# Patient Record
Sex: Female | Born: 1985 | Race: White | Hispanic: No | Marital: Married | Smoking: Never smoker
Health system: Southern US, Community
[De-identification: ages and names within clinical notes are randomized; demographics above are authoritative.]

## PROBLEM LIST (undated history)

## (undated) DIAGNOSIS — R112 Nausea with vomiting, unspecified: Secondary | ICD-10-CM

## (undated) DIAGNOSIS — R519 Headache, unspecified: Secondary | ICD-10-CM

## (undated) DIAGNOSIS — R569 Unspecified convulsions: Secondary | ICD-10-CM

## (undated) DIAGNOSIS — Z9889 Other specified postprocedural states: Secondary | ICD-10-CM

## (undated) DIAGNOSIS — F419 Anxiety disorder, unspecified: Secondary | ICD-10-CM

## (undated) DIAGNOSIS — R51 Headache: Secondary | ICD-10-CM

## (undated) DIAGNOSIS — F329 Major depressive disorder, single episode, unspecified: Secondary | ICD-10-CM

## (undated) DIAGNOSIS — F32A Depression, unspecified: Secondary | ICD-10-CM

## (undated) DIAGNOSIS — O24419 Gestational diabetes mellitus in pregnancy, unspecified control: Secondary | ICD-10-CM

## (undated) HISTORY — DX: Depression, unspecified: F32.A

## (undated) HISTORY — DX: Headache: R51

## (undated) HISTORY — DX: Gestational diabetes mellitus in pregnancy, unspecified control: O24.419

## (undated) HISTORY — DX: Major depressive disorder, single episode, unspecified: F32.9

## (undated) HISTORY — DX: Headache, unspecified: R51.9

## (undated) HISTORY — DX: Anxiety disorder, unspecified: F41.9

## (undated) HISTORY — PX: FRACTURE SURGERY: SHX138

---
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
## 2007-01-07 ENCOUNTER — Inpatient Hospital Stay (HOSPITAL_COMMUNITY): Admission: AD | Admit: 2007-01-07 | Discharge: 2007-01-07 | Payer: Self-pay | Admitting: *Deleted

## 2007-03-20 ENCOUNTER — Inpatient Hospital Stay (HOSPITAL_COMMUNITY): Admission: AD | Admit: 2007-03-20 | Discharge: 2007-03-20 | Payer: Self-pay | Admitting: Obstetrics and Gynecology

## 2007-04-19 ENCOUNTER — Encounter (INDEPENDENT_AMBULATORY_CARE_PROVIDER_SITE_OTHER): Payer: Self-pay | Admitting: Obstetrics and Gynecology

## 2007-04-19 ENCOUNTER — Inpatient Hospital Stay (HOSPITAL_COMMUNITY): Admission: AD | Admit: 2007-04-19 | Discharge: 2007-04-22 | Payer: Self-pay | Admitting: Obstetrics and Gynecology

## 2007-04-23 ENCOUNTER — Encounter: Admission: RE | Admit: 2007-04-23 | Discharge: 2007-05-22 | Payer: Self-pay | Admitting: *Deleted

## 2007-04-24 ENCOUNTER — Inpatient Hospital Stay (HOSPITAL_COMMUNITY): Admission: AD | Admit: 2007-04-24 | Discharge: 2007-04-24 | Payer: Self-pay | Admitting: *Deleted

## 2007-05-23 ENCOUNTER — Encounter: Admission: RE | Admit: 2007-05-23 | Discharge: 2007-06-22 | Payer: Self-pay | Admitting: *Deleted

## 2007-06-23 ENCOUNTER — Encounter: Admission: RE | Admit: 2007-06-23 | Discharge: 2007-07-22 | Payer: Self-pay | Admitting: *Deleted

## 2007-07-23 ENCOUNTER — Encounter: Admission: RE | Admit: 2007-07-23 | Discharge: 2007-08-22 | Payer: Self-pay | Admitting: *Deleted

## 2007-08-23 ENCOUNTER — Encounter: Admission: RE | Admit: 2007-08-23 | Discharge: 2007-09-22 | Payer: Self-pay | Admitting: *Deleted

## 2007-09-23 ENCOUNTER — Encounter: Admission: RE | Admit: 2007-09-23 | Discharge: 2007-10-11 | Payer: Self-pay | Admitting: *Deleted

## 2007-10-21 ENCOUNTER — Encounter: Admission: RE | Admit: 2007-10-21 | Discharge: 2007-11-20 | Payer: Self-pay | Admitting: *Deleted

## 2007-11-21 ENCOUNTER — Encounter: Admission: RE | Admit: 2007-11-21 | Discharge: 2007-12-20 | Payer: Self-pay | Admitting: *Deleted

## 2007-12-21 ENCOUNTER — Encounter: Admission: RE | Admit: 2007-12-21 | Discharge: 2008-01-20 | Payer: Self-pay | Admitting: *Deleted

## 2008-01-21 ENCOUNTER — Encounter: Admission: RE | Admit: 2008-01-21 | Discharge: 2008-02-19 | Payer: Self-pay | Admitting: *Deleted

## 2008-02-20 ENCOUNTER — Encounter: Admission: RE | Admit: 2008-02-20 | Discharge: 2008-03-21 | Payer: Self-pay | Admitting: *Deleted

## 2008-07-24 ENCOUNTER — Emergency Department (HOSPITAL_BASED_OUTPATIENT_CLINIC_OR_DEPARTMENT_OTHER): Admission: EM | Admit: 2008-07-24 | Discharge: 2008-07-24 | Payer: Self-pay | Admitting: Emergency Medicine

## 2010-12-26 NOTE — Discharge Summary (Signed)
[name], [name]            ACCOUNT NO.:  [id number]   MEDICAL RECORD NO.:  [id number]          PATIENT TYPE:  INP   LOCATION:  9124                          FACILITY:  [facility]   PHYSICIAN:  Maxie Better, M.D.DATE OF BIRTH:  Aug 11, 1986   DATE OF ADMISSION:  04/19/2007  DATE OF DISCHARGE:  04/22/2007                               DISCHARGE SUMMARY   ADMISSION DIAGNOSES:  1. Active labor.  2. Term gestation.  3. Iron Deficiency Anemia   DISCHARGE DIAGNOSES:  1. Term gestation, delivered.  2. Arrest of descent.  3. Iron-deficiency anemia.   PROCEDURE:  Primary cesarean section.   HISTORY OF PRESENT ILLNESS:  A 25 year old, gravida 1, para 0 female at  term presented in active labor.   HOSPITAL COURSE:  The patient was admitted.  At the time of her  admission, she was 4-5 cm, bulging membranes, -1 station.  Group B strep  was found to be positive.  The patient was admitted.  She got group B  strep prophylaxis.  The patient subsequently had epidural for pain  management, spontaneous rupture of membranes, clear fluid.  Low-dose  Pitocin was planned as needed.  The patient progressed well, pushed for  2+ hours without any descent or ability to deliver vaginally.  She was  taken to the operating room where she underwent a primary cesarean  section, live female, 8 pounds 12 ounces, Apgars 8 and 8, normal tubes  and ovaries.  The patient had an uncomplicated postoperative course.  Her CBC on postoperative day #1 showed a hemoglobin of 8.8, hematocrit  25.9, platelet count 273,000, white count 19.1.  She had an admission  hemoglobin 10.4.  The blood type was O+, rubella was immune.  By  postoperative day #3, she was tolerating a regular diet.  Incision had  no evidence of an infection.  She was asymptomatic from her anemia.  She  was started on iron supplementation.  The patient was deemed well to be  discharged home.   DISPOSITION:  Is home.   CONDITION:  Stable.   DISCHARGE MEDICATIONS:  1. Zoloft 50 mg p.o. daily.  2. Prenatal vitamins 1 p.o. daily.  3. Repliva.  4. Iron supplementation 1 p.o. daily.  5. Motrin 600 mg every 8 hours p.r.n. pain.  6. Percocet 1-2 tablets every 4 hours p.r.n. pain.   DISCHARGE INSTRUCTIONS:  Per the postpartum booklet given.   FOLLOW-UP APPOINTMENT:  At [facility] in 6 weeks.      Maxie Better, M.D.  Electronically Signed     [place]/MEDQ  D:  05/25/2007  T:  05/26/2007  Job:  [id number]

## 2010-12-26 NOTE — Op Note (Signed)
[name], [name]            ACCOUNT NO.:  [id number]   MEDICAL RECORD NO.:  [id number]          PATIENT TYPE:  INP   LOCATION:  9124                          [facility]ILITY:  [facility]   PHYSICIAN:  Maxie Better, M.D.DATE OF BIRTH:  12-11-1985   DATE OF PROCEDURE:  04/19/2007  DATE OF DISCHARGE:                               OPERATIVE REPORT   PREOPERATIVE DIAGNOSIS:  Arrest of descent.   PROCEDURE:  Primary cesarean section Kerr hysterotomy.   POSTOPERATIVE DIAGNOSIS:  Arrest of descent.   ANESTHESIA:  Epidural.   SURGEON:  Maxie Better, M.D.   ASSISTANT:  Osborn Coho, M.D.   PROCEDURE:  Under adequate epidural anesthesia, the patient was placed  in a supine position.  She was sterilely prepped and draped in usual  fashion.  An indwelling Foley catheter was already in place.  Pfannenstiel skin incision was then made, carried down to the rectus  fascia.  The rectus fascia was opened transversely.  The rectus fascia  was then bluntly and sharply dissected off the rectus muscle in superior  and inferior fashion.  The rectus muscles was split in the midline.  The  parietal peritoneum was opened sharply and extended.  The vesicouterine  peritoneum was opened transversely.  The bladder was then bluntly  dissected off the lower uterine segment and was placed inferiorly.  A  curvilinear low transverse uterine incision was then made and extended  with bandage scissors.  Subsequent delivery of a live female was  accomplished.  It was bulb suctioned on the abdomen.  The cord was  clamped and cut.  The baby was transferred to the awaiting pediatricians  who assigned Apgars of 8 and 8 at one and five minutes.  The placenta  was spontaneous, intact, sent to pathology.  Uterine cavity was cleaned  of debris.  Uterine incision had a slight extension on the left angle  otherwise it was unremarkable.  Uterine incision was then closed with 0  Monocryl running-locked stitch first  layer, second layer was imbricated  using 0 Monocryl suture.  Good hemostasis was then noted.  Normal tubes  and ovaries were noted bilaterally.  The abdomen was then copiously  irrigated, suctioned of debris.  Reinspection of the incision site  showed good hemostasis.  The urine was reported as clear yellow urine at  that time.  The parietal peritoneum was then closed with 2-0 Vicryl  suture.  The rectus fascia was closed with 0 Vicryl x2.  The  subcutaneous area was irrigated, small bleeders cauterized.  Interrupted  2-0 plain sutures were then placed and then the skin approximated with  Ethicon staples.  Specimen was placenta sent to pathology.  Estimated  blood loss was 800 mL.  Intraoperative fluid was 900 mL.  Urine output  300 mL.  Sponge and instrument counts x2 were correct.  Complication was  none.  Weight of the baby was 8 pounds 12 ounces.  The patient tolerated  the procedure well, was transferred to recovery in stable condition.     Maxie Better, M.D.  Electronically Signed    [place]/MEDQ  D:  04/19/2007  T:  04/20/2007  Job:  [id number]

## 2010-12-26 NOTE — Consult Note (Signed)
[name], [name]            ACCOUNT NO.:  [id number]   MEDICAL RECORD NO.:  [id number]          PATIENT TYPE:  MAT   LOCATION:  MATC                          [facility]ACILITY:  [facility]   PHYSICIAN:  Lenoard Aden, M.D.DATE OF BIRTH:  1985-11-13   DATE OF CONSULTATION:  DATE OF DISCHARGE:                                 CONSULTATION   CHIEF COMPLAINT:  Questionable rupture of membranes.   She is a 25 year old white female, G1, P0 at 3 weeks' gestation with a  questionable fluid leakage in the shower.  She is a nonsmoker,  nondrinker.  She denies domestic or physical violence.   MEDICATIONS:  Prenatal vitamins and Zoloft.   She has a history of:  1. Anxiety.  2. Seizure disorder as a child.  3. History of abnormal Pap smear.   SOCIAL HISTORY:  Noncontributory.   FAMILY HISTORY:  Colon cancer, diabetes, hypertension, heart disease,  pancreatic cancer.   PHYSICAL EXAMINATION:  GENERAL:  She is an anxious-appearing white  female in no acute distress.  VITAL SIGNS:  Stable, afebrile.  HEENT:  LUNGS:  Clear.  HEART:  Reveals tachycardia but otherwise normal rate and rhythm.  ABDOMEN:  Soft, gravid, nontender.  PELVIC:  Reveals negative fern.  Negative Nitrazine.  Cervix is 2-cm,  50% vertex, minus 1.  Membranes are palpable.  No pooling or fluid  leakage is noted.  EXTREMITIES:  Show no cords.  NEUROLOGIC:  Nonfocal.  SKIN:  Intact.   Initial fetal heart rate in the 180s with notable maternal anxiety.  No  contractions noted.  Fetal heart rate is now in the 150s with good beat-  to-beat variability and accelerations noted.   IMPRESSION:  A 36-week intrauterine pregnancy with no evidence of  spontaneous rupture of membranes.   PLAN:  We will await a reactive NST.  Upon reassuring fetal heart rate  tracing, we will discharge home.  Precautions given.      Lenoard Aden, M.D.  Electronically Signed     [facility]/MEDQ  D:  03/20/2007  T:  03/20/2007  Job:  [id number]

## 2011-01-29 ENCOUNTER — Other Ambulatory Visit: Payer: Self-pay | Admitting: Obstetrics and Gynecology

## 2011-03-14 HISTORY — PX: CERVICAL BIOPSY  W/ LOOP ELECTRODE EXCISION: SUR135

## 2011-05-18 LAB — CBC
HCT: 33.3 % — ABNORMAL LOW (ref 36.0–46.0)
Hemoglobin: 11.1 g/dL — ABNORMAL LOW (ref 12.0–15.0)
MCHC: 33.5 g/dL (ref 30.0–36.0)
MCV: 87.1 fL (ref 78.0–100.0)
RBC: 3.82 MIL/uL — ABNORMAL LOW (ref 3.87–5.11)

## 2011-05-18 LAB — URINALYSIS, ROUTINE W REFLEX MICROSCOPIC
Bilirubin Urine: NEGATIVE
Glucose, UA: NEGATIVE mg/dL
Hgb urine dipstick: NEGATIVE
Specific Gravity, Urine: 1.019 (ref 1.005–1.030)
pH: 7 (ref 5.0–8.0)

## 2011-05-18 LAB — BASIC METABOLIC PANEL
BUN: 11 mg/dL (ref 6–23)
CO2: 27 mEq/L (ref 19–32)
Calcium: 9.5 mg/dL (ref 8.4–10.5)
Chloride: 102 mEq/L (ref 96–112)
Creatinine, Ser: 0.6 mg/dL (ref 0.4–1.2)
GFR calc Af Amer: >60 mL/min (ref >60)
GFR calc non Af Amer: >60 mL/min (ref >60)
Glucose, Bld: 106 mg/dL — ABNORMAL HIGH (ref 70–99)
Potassium: 4.2 mEq/L (ref 3.5–5.1)
Sodium: 138 mEq/L (ref 135–145)

## 2011-05-18 LAB — URINE MICROSCOPIC-ADD ON

## 2011-05-18 LAB — OCCULT BLOOD X 1 CARD TO LAB, STOOL: Fecal Occult Bld: POSITIVE

## 2011-05-25 LAB — COMPREHENSIVE METABOLIC PANEL
ALT: 18
Albumin: 2.9 — ABNORMAL LOW
Alkaline Phosphatase: 175 — ABNORMAL HIGH
BUN: 6
Chloride: 104
Potassium: 3.5
Sodium: 141
Total Bilirubin: 0.7
Total Protein: 6.6

## 2011-05-25 LAB — URIC ACID: Uric Acid, Serum: 7.5 — ABNORMAL HIGH

## 2011-05-25 LAB — CBC
HCT: 25.9 — ABNORMAL LOW
HCT: 30.2 — ABNORMAL LOW
Hemoglobin: 8.8 — ABNORMAL LOW
Hemoglobin: 9.9 — ABNORMAL LOW
Platelets: 237
Platelets: 306
RBC: 4.02
WBC: 14.7 — ABNORMAL HIGH
WBC: 14.8 — ABNORMAL HIGH
WBC: 19.1 — ABNORMAL HIGH

## 2011-05-25 LAB — SAMPLE TO BLOOD BANK

## 2011-05-25 LAB — RPR: RPR Ser Ql: NONREACTIVE

## 2011-10-16 ENCOUNTER — Ambulatory Visit (INDEPENDENT_AMBULATORY_CARE_PROVIDER_SITE_OTHER): Payer: 59 | Admitting: Family Medicine

## 2011-10-16 VITALS — BP 121/73 | HR 99 | Temp 98.0°F | Resp 16 | Ht 63.0 in | Wt 146.0 lb

## 2011-10-16 DIAGNOSIS — J069 Acute upper respiratory infection, unspecified: Secondary | ICD-10-CM

## 2011-10-16 DIAGNOSIS — Z3201 Encounter for pregnancy test, result positive: Secondary | ICD-10-CM

## 2011-10-16 MED ORDER — IPRATROPIUM BROMIDE 0.06 % NA SOLN: 2.0000 | Freq: Three times a day (TID) | NASAL | Status: DC

## 2011-10-16 NOTE — Progress Notes (Signed)
Patient ID: [name] MRN: [id number], DOB: [birth year], 25 y.o. Date of Encounter: 10/16/2011, 2:13 PM  Primary Physician: No primary provider on file.  Chief Complaint:  Chief Complaint  Patient presents with  . Sinusitis    x  3 days  congestion  . Headache  . Cough    HPI: 26 y.o. year old female that is [redacted] weeks pregnant presents with 3 day history of nasal congestion, post nasal drip, and sinus pressure. Afebrile. No chills. No sore throat. Mild nonproductive cough. Sinus pressure has caused a sinus headache. No focal deficits. Normal hearing. No GI complaints. Has tried Tylenol for the headache with ok results.  Patient is also [redacted] weeks pregnant. Has routine appointment with OB, Dr. Vickey Sages, on 10/17/11. No abdominal pain or bleeding. She will discuss further treatment at that time.  No leg trauma, sedentary periods, h/o cancer, or tobacco use.  History reviewed. No pertinent past medical history.   Home Meds: Prior to Admission medications   Medication Sig Start Date End Date Taking? Authorizing Provider  prenatal vitamin w/FE, FA (NATACHEW) 29-1 MG CHEW Chew 1 tablet by mouth daily.   Yes Historical Provider, MD    Allergies:  Allergies  Allergen Reactions  . Latex     History   Social History  . Marital Status: Married    Spouse Name: N/A    Number of Children: N/A  . Years of Education: N/A   Occupational History  . Not on file.   Social History Main Topics  . Smoking status: Never Smoker   . Smokeless tobacco: Not on file  . Alcohol Use: No  . Drug Use: No  . Sexually Active: Not on file   Other Topics Concern  . Not on file   Social History Narrative  . No narrative on file     Review of Systems: Constitutional: negative for chills, fever, night sweats or weight changes Cardiovascular: negative for chest pain or palpitations Respiratory: negative for hemoptysis, wheezing, or shortness of breath Abdominal: negative for abdominal pain,  nausea, vomiting or diarrhea Dermatological: negative for rash Neurologic: negative for headache   Physical Exam: Blood pressure 121/73, pulse 99, temperature 98 F (36.7 C), temperature source Oral, resp. rate 16, height 5\' 3"  (1.6 m), weight 146 lb (66.225 kg)., Body mass index is 25.86 kg/(m^2). General: Well developed, well nourished, in no acute distress. Head: Normocephalic, atraumatic, eyes without discharge, sclera non-icteric, nares are congested. Bilateral auditory canals clear, TM's are without perforation, pearly grey with reflective cone of light bilaterally. Serous effusion bilaterally behind TM's. Left maxillary sinus TTP. Oral cavity moist, dentition normal. Posterior pharynx with post nasal drip. No peritonsillar abscess, erythema, or tonsillar exudate. Neck: Supple. No thyromegaly. Full ROM. No lymphadenopathy. Lungs: Clear bilaterally to auscultation without wheezes, rales, or rhonchi. Breathing is unlabored. Heart: RRR with S1 S2. No murmurs, rubs, or gallops appreciated. Msk:  Strength and tone normal for age. Extremities: No clubbing or cyanosis. No edema. Neuro: Alert and oriented X 3. Moves all extremities spontaneously. CNII-XII grossly in tact. Psych:  Responds to questions appropriately with a normal affect.     ASSESSMENT AND PLAN:  26 y.o. year old female with viral URI and pregnancy. -Atrovent NS 0.06% 2 sprays each nare bid prn #1 no RF -Discussed risks/benefits of above medication with usage in pregnancy in first trimester. She understands this and wished to proceed with symptomatic treatment. -Tylenol prn -Rest/fluids -RTC precautions -RTC 3-5 days if no improvement -  Follow up as directed with OB  Signed, Eula Listen, PA-C 10/16/2011 2:13 PM

## 2011-10-17 LAB — OB RESULTS CONSOLE RPR: RPR: NONREACTIVE

## 2011-10-17 LAB — OB RESULTS CONSOLE RUBELLA ANTIBODY, IGM: Rubella: IMMUNE

## 2011-10-17 LAB — OB RESULTS CONSOLE ABO/RH: RH Type: POSITIVE

## 2011-10-17 LAB — OB RESULTS CONSOLE HEPATITIS B SURFACE ANTIGEN: Hepatitis B Surface Ag: NEGATIVE

## 2011-10-17 LAB — OB RESULTS CONSOLE HIV ANTIBODY (ROUTINE TESTING): HIV: NONREACTIVE

## 2012-03-05 ENCOUNTER — Encounter: Payer: 59 | Attending: Obstetrics and Gynecology | Admitting: *Deleted

## 2012-03-05 DIAGNOSIS — Z713 Dietary counseling and surveillance: Secondary | ICD-10-CM | POA: Insufficient documentation

## 2012-03-05 DIAGNOSIS — O99810 Abnormal glucose complicating pregnancy: Secondary | ICD-10-CM | POA: Insufficient documentation

## 2012-03-06 ENCOUNTER — Encounter: Payer: Self-pay | Admitting: *Deleted

## 2012-03-06 NOTE — Patient Instructions (Signed)
Goals:  Check glucose levels per MD as instructed  Follow Gestational Diabetes Diet as instructed  Call for follow-up as needed    

## 2012-03-06 NOTE — Progress Notes (Signed)
  Patient was seen on 03/05/2012 for Gestational Diabetes self-management class at the [facility]. The following learning objectives were met by the patient during this course:   States the definition of Gestational Diabetes  States why dietary management is important in controlling blood glucose  Describes the effects each nutrient has on blood glucose levels  Demonstrates ability to create a balanced meal plan  Demonstrates carbohydrate counting   States when to check blood glucose levels  Demonstrates proper blood glucose monitoring techniques  States the effect of stress and exercise on blood glucose levels  States the importance of limiting caffeine and abstaining from alcohol and smoking  Blood glucose monitor given:  One Touch Ultra Mini Self Monitoring Kit Lot # [id number] X Exp: 08/2012 Blood glucose reading: 88 mg/dl  Patient instructed to monitor glucose levels: FBS: 60 - <90 2 hour: <120  *Patient received handouts:  Nutrition Diabetes and Pregnancy  Carbohydrate Counting List  Patient will be seen for follow-up as needed.

## 2012-05-01 ENCOUNTER — Encounter (HOSPITAL_COMMUNITY): Payer: Self-pay | Admitting: Pharmacist

## 2012-05-07 ENCOUNTER — Encounter (HOSPITAL_COMMUNITY): Payer: Self-pay

## 2012-05-08 ENCOUNTER — Encounter (HOSPITAL_COMMUNITY)
Admission: RE | Admit: 2012-05-08 | Discharge: 2012-05-08 | Disposition: A | Discharge status: Home / Self Care | Payer: 59 | Source: Ambulatory Visit | Attending: Obstetrics and Gynecology | Admitting: Obstetrics and Gynecology

## 2012-05-08 ENCOUNTER — Encounter (HOSPITAL_COMMUNITY): Payer: Self-pay

## 2012-05-08 HISTORY — DX: Unspecified convulsions: R56.9

## 2012-05-08 LAB — BASIC METABOLIC PANEL
BUN: 6 mg/dL (ref 6–23)
Chloride: 100 mEq/L (ref 96–112)
GFR calc Af Amer: >90 mL/min (ref >90)
GFR calc non Af Amer: >90 mL/min (ref >90)
Potassium: 3.7 mEq/L (ref 3.5–5.1)
Sodium: 132 mEq/L — ABNORMAL LOW (ref 135–145)

## 2012-05-08 LAB — CBC
MCH: 28 pg (ref 26.0–34.0)
MCHC: 32.5 g/dL (ref 30.0–36.0)
MCV: 86 fL (ref 78.0–100.0)
Platelets: 172 10*3/uL (ref 150–400)
RBC: 3.93 MIL/uL (ref 3.87–5.11)
RDW: 13.9 % (ref 11.5–15.5)

## 2012-05-08 LAB — ABO/RH: ABO/RH(D): O POS

## 2012-05-08 LAB — TYPE AND SCREEN
ABO/RH(D): O POS
Antibody Screen: NEGATIVE

## 2012-05-08 LAB — RPR: RPR Ser Ql: NONREACTIVE

## 2012-05-08 NOTE — Patient Instructions (Addendum)
   Your procedure is scheduled ZO:[id number] October 3rd  Enter through the Main Entrance of [facility] at: 11:30am Pick up the phone at the desk and dial [phone] and inform us of your arrival.  Please call this number if you have any problems the morning of surgery: [phone]  Remember: Do not eat food after midnight Wednesday You may drink clear liquids until 9am then nothing   Do not wear jewelry, make-up, or FINGER nail polish No metal in your hair or on your body. Do not wear lotions, powders, perfumes. You may wear deodorant.  Please use your CHG wash as directed prior to surgery.  Do not shave anywhere for at least 12 hours prior to first CHG shower.  Do not bring valuables to the hospital.  Leave suitcase in the car. After Surgery it may be brought to your room. For patients being admitted to the hospital, checkout time is 11:00am the day of discharge.

## 2012-05-15 ENCOUNTER — Encounter (HOSPITAL_COMMUNITY): Payer: Self-pay

## 2012-05-15 ENCOUNTER — Encounter (HOSPITAL_COMMUNITY)
Admission: AD | Disposition: A | Discharge status: Home / Self Care | Payer: Self-pay | Source: Ambulatory Visit | Attending: Obstetrics and Gynecology

## 2012-05-15 ENCOUNTER — Inpatient Hospital Stay (HOSPITAL_COMMUNITY): Payer: 59

## 2012-05-15 ENCOUNTER — Inpatient Hospital Stay (HOSPITAL_COMMUNITY)
Admission: AD | Admit: 2012-05-15 | Discharge: 2012-05-18 | DRG: 766 | Disposition: A | Discharge status: Home / Self Care | Payer: 59 | Source: Ambulatory Visit | Attending: Obstetrics and Gynecology | Admitting: Obstetrics and Gynecology

## 2012-05-15 ENCOUNTER — Encounter (HOSPITAL_COMMUNITY): Payer: Self-pay | Admitting: *Deleted

## 2012-05-15 DIAGNOSIS — Z302 Encounter for sterilization: Secondary | ICD-10-CM

## 2012-05-15 DIAGNOSIS — Z01818 Encounter for other preprocedural examination: Secondary | ICD-10-CM

## 2012-05-15 DIAGNOSIS — O34219 Maternal care for unspecified type scar from previous cesarean delivery: Principal | ICD-10-CM | POA: Diagnosis present

## 2012-05-15 DIAGNOSIS — Z98891 History of uterine scar from previous surgery: Secondary | ICD-10-CM

## 2012-05-15 DIAGNOSIS — Z01812 Encounter for preprocedural laboratory examination: Secondary | ICD-10-CM

## 2012-05-15 LAB — GLUCOSE, CAPILLARY: Glucose-Capillary: 81 mg/dL (ref 70–99)

## 2012-05-15 LAB — TYPE AND SCREEN
ABO/RH(D): O POS
Antibody Screen: NEGATIVE

## 2012-05-15 SURGERY — Surgical Case
Anesthesia: Regional | Site: Abdomen | Laterality: Bilateral | Wound class: Clean Contaminated

## 2012-05-15 MED ORDER — PROMETHAZINE HCL 25 MG/ML IJ SOLN: 6.2500 mg | INTRAMUSCULAR | Status: DC | PRN

## 2012-05-15 MED ORDER — DEXTROSE IN LACTATED RINGERS 5 % IV SOLN
INTRAVENOUS | Status: DC
  Administered 2012-05-15: 23:00:00 via INTRAVENOUS

## 2012-05-15 MED ORDER — MEDROXYPROGESTERONE ACETATE 150 MG/ML IM SUSP: 150.0000 mg | INTRAMUSCULAR | Status: DC | PRN

## 2012-05-15 MED ORDER — FENTANYL CITRATE 0.05 MG/ML IJ SOLN
INTRAMUSCULAR | Status: DC | PRN
  Administered 2012-05-15: 25 ug via INTRATHECAL

## 2012-05-15 MED ORDER — SODIUM CHLORIDE 0.9 % IJ SOLN: 3.0000 mL | INTRAMUSCULAR | Status: DC | PRN

## 2012-05-15 MED ORDER — NALBUPHINE SYRINGE 5 MG/0.5 ML
5.0000 mg | INJECTION | INTRAMUSCULAR | Status: DC | PRN
  Filled 2012-05-15: qty 1

## 2012-05-15 MED ORDER — MORPHINE SULFATE 0.5 MG/ML IJ SOLN
INTRAMUSCULAR | Status: AC
Start: 2012-05-15 — End: 2012-05-15
  Filled 2012-05-15: qty 10

## 2012-05-15 MED ORDER — NALOXONE HCL 0.4 MG/ML IJ SOLN: 0.4000 mg | INTRAMUSCULAR | Status: DC | PRN

## 2012-05-15 MED ORDER — TETANUS-DIPHTH-ACELL PERTUSSIS 5-2.5-18.5 LF-MCG/0.5 IM SUSP: 0.5000 mL | Freq: Once | INTRAMUSCULAR | Status: DC

## 2012-05-15 MED ORDER — WITCH HAZEL-GLYCERIN EX PADS: 1.0000 "application " | MEDICATED_PAD | CUTANEOUS | Status: DC | PRN

## 2012-05-15 MED ORDER — DIPHENHYDRAMINE HCL 25 MG PO CAPS
25.0000 mg | ORAL_CAPSULE | ORAL | Status: DC | PRN
  Administered 2012-05-16: 25 mg via ORAL
  Filled 2012-05-15 (×2): qty 1

## 2012-05-15 MED ORDER — LACTATED RINGERS IV SOLN
INTRAVENOUS | Status: DC
  Administered 2012-05-15 (×4): via INTRAVENOUS

## 2012-05-15 MED ORDER — ACETAMINOPHEN 10 MG/ML IV SOLN
1000.0000 mg | Freq: Four times a day (QID) | INTRAVENOUS | Status: AC | PRN
  Administered 2012-05-15: 1000 mg via INTRAVENOUS
  Filled 2012-05-15 (×2): qty 100

## 2012-05-15 MED ORDER — OXYCODONE-ACETAMINOPHEN 5-325 MG PO TABS
1.0000 | ORAL_TABLET | ORAL | Status: DC | PRN
Start: 2012-05-15 — End: 2012-05-18
  Administered 2012-05-16 (×2): 1 via ORAL
  Administered 2012-05-16: 2 via ORAL
  Administered 2012-05-17 (×4): 1 via ORAL
  Administered 2012-05-18: 2 via ORAL
  Administered 2012-05-18: 1 via ORAL
  Filled 2012-05-15 (×11): qty 1

## 2012-05-15 MED ORDER — ONDANSETRON HCL 4 MG/2ML IJ SOLN: 4.0000 mg | INTRAMUSCULAR | Status: DC | PRN

## 2012-05-15 MED ORDER — MEPERIDINE HCL 25 MG/ML IJ SOLN: 6.2500 mg | INTRAMUSCULAR | Status: DC | PRN

## 2012-05-15 MED ORDER — DIBUCAINE 1 % RE OINT: 1.0000 "application " | TOPICAL_OINTMENT | RECTAL | Status: DC | PRN

## 2012-05-15 MED ORDER — FENTANYL CITRATE 0.05 MG/ML IJ SOLN
INTRAMUSCULAR | Status: AC
  Filled 2012-05-15: qty 2

## 2012-05-15 MED ORDER — OXYTOCIN 40 UNITS IN LACTATED RINGERS INFUSION - SIMPLE MED: 62.5000 mL/h | INTRAVENOUS | Status: AC

## 2012-05-15 MED ORDER — ONDANSETRON HCL 4 MG/2ML IJ SOLN
INTRAMUSCULAR | Status: DC | PRN
  Administered 2012-05-15: 4 mg via INTRAVENOUS

## 2012-05-15 MED ORDER — METOCLOPRAMIDE HCL 5 MG/ML IJ SOLN: 10.0000 mg | Freq: Three times a day (TID) | INTRAMUSCULAR | Status: DC | PRN

## 2012-05-15 MED ORDER — NALBUPHINE SYRINGE 5 MG/0.5 ML
INJECTION | INTRAMUSCULAR | Status: AC
  Filled 2012-05-15: qty 1

## 2012-05-15 MED ORDER — FENTANYL CITRATE 0.05 MG/ML IJ SOLN: 25.0000 ug | INTRAMUSCULAR | Status: DC | PRN

## 2012-05-15 MED ORDER — IBUPROFEN 600 MG PO TABS
600.0000 mg | ORAL_TABLET | Freq: Four times a day (QID) | ORAL | Status: DC
  Administered 2012-05-15 – 2012-05-18 (×10): 600 mg via ORAL
  Filled 2012-05-15 (×10): qty 1

## 2012-05-15 MED ORDER — SODIUM CHLORIDE 0.9 % IV SOLN
1.0000 ug/kg/h | INTRAVENOUS | Status: DC | PRN
  Filled 2012-05-15: qty 2.5

## 2012-05-15 MED ORDER — OXYTOCIN 10 UNIT/ML IJ SOLN
40.0000 [IU] | INTRAVENOUS | Status: DC | PRN
  Administered 2012-05-15: 13:00:00 via INTRAVENOUS

## 2012-05-15 MED ORDER — MEASLES, MUMPS & RUBELLA VAC ~~LOC~~ INJ: 0.5000 mL | INJECTION | Freq: Once | SUBCUTANEOUS | Status: DC

## 2012-05-15 MED ORDER — LANOLIN HYDROUS EX OINT: 1.0000 "application " | TOPICAL_OINTMENT | CUTANEOUS | Status: DC | PRN

## 2012-05-15 MED ORDER — PRENATAL MULTIVITAMIN CH
1.0000 | ORAL_TABLET | Freq: Every day | ORAL | Status: DC
  Administered 2012-05-16 – 2012-05-18 (×3): 1 via ORAL
  Filled 2012-05-15 (×3): qty 1

## 2012-05-15 MED ORDER — DIPHENHYDRAMINE HCL 50 MG/ML IJ SOLN: 12.5000 mg | INTRAMUSCULAR | Status: DC | PRN

## 2012-05-15 MED ORDER — KETOROLAC TROMETHAMINE 30 MG/ML IJ SOLN
30.0000 mg | Freq: Four times a day (QID) | INTRAMUSCULAR | Status: AC | PRN
  Administered 2012-05-15 – 2012-05-16 (×2): 30 mg via INTRAVENOUS
  Filled 2012-05-15 (×2): qty 1

## 2012-05-15 MED ORDER — DIPHENHYDRAMINE HCL 50 MG/ML IJ SOLN: 25.0000 mg | INTRAMUSCULAR | Status: DC | PRN

## 2012-05-15 MED ORDER — ONDANSETRON HCL 4 MG PO TABS: 4.0000 mg | ORAL_TABLET | ORAL | Status: DC | PRN

## 2012-05-15 MED ORDER — KETOROLAC TROMETHAMINE 30 MG/ML IJ SOLN
30.0000 mg | Freq: Four times a day (QID) | INTRAMUSCULAR | Status: AC | PRN
  Administered 2012-05-15: 30 mg via INTRAMUSCULAR

## 2012-05-15 MED ORDER — BUPIVACAINE IN DEXTROSE 0.75-8.25 % IT SOLN
INTRATHECAL | Status: DC | PRN
  Administered 2012-05-15: 1.5 mL via INTRATHECAL

## 2012-05-15 MED ORDER — ONDANSETRON HCL 4 MG/2ML IJ SOLN: 4.0000 mg | Freq: Three times a day (TID) | INTRAMUSCULAR | Status: DC | PRN

## 2012-05-15 MED ORDER — SCOPOLAMINE 1 MG/3DAYS TD PT72: 1.0000 | MEDICATED_PATCH | Freq: Once | TRANSDERMAL | Status: DC

## 2012-05-15 MED ORDER — HYDROMORPHONE HCL PF 1 MG/ML IJ SOLN
0.5000 mg | Freq: Once | INTRAMUSCULAR | Status: AC
  Administered 2012-05-15: 0.5 mg via INTRAVENOUS

## 2012-05-15 MED ORDER — INFLUENZA VIRUS VACC SPLIT PF IM SUSP
0.5000 mL | INTRAMUSCULAR | Status: AC
Start: 2012-05-16 — End: 2012-05-16
  Administered 2012-05-16: 0.5 mL via INTRAMUSCULAR
  Filled 2012-05-15: qty 0.5

## 2012-05-15 MED ORDER — KETOROLAC TROMETHAMINE 30 MG/ML IJ SOLN
INTRAMUSCULAR | Status: AC
  Administered 2012-05-15: 30 mg via INTRAMUSCULAR
  Filled 2012-05-15: qty 1

## 2012-05-15 MED ORDER — ONDANSETRON HCL 4 MG/2ML IJ SOLN
INTRAMUSCULAR | Status: AC
  Filled 2012-05-15: qty 2

## 2012-05-15 MED ORDER — SENNOSIDES-DOCUSATE SODIUM 8.6-50 MG PO TABS
2.0000 | ORAL_TABLET | Freq: Every day | ORAL | Status: DC
  Administered 2012-05-15 – 2012-05-17 (×3): 2 via ORAL

## 2012-05-15 MED ORDER — CEFAZOLIN SODIUM-DEXTROSE 2-3 GM-% IV SOLR: 2.0000 g | INTRAVENOUS | Status: DC

## 2012-05-15 MED ORDER — MORPHINE SULFATE (PF) 0.5 MG/ML IJ SOLN
INTRAMUSCULAR | Status: DC | PRN
  Administered 2012-05-15: .1 mg via INTRATHECAL

## 2012-05-15 MED ORDER — MENTHOL 3 MG MT LOZG: 1.0000 | LOZENGE | OROMUCOSAL | Status: DC | PRN

## 2012-05-15 MED ORDER — HYDROMORPHONE HCL PF 1 MG/ML IJ SOLN
INTRAMUSCULAR | Status: AC
  Administered 2012-05-15: 0.5 mg via INTRAVENOUS
  Filled 2012-05-15: qty 1

## 2012-05-15 MED ORDER — DIPHENHYDRAMINE HCL 25 MG PO CAPS: 25.0000 mg | ORAL_CAPSULE | Freq: Four times a day (QID) | ORAL | Status: DC | PRN

## 2012-05-15 MED ORDER — SCOPOLAMINE 1 MG/3DAYS TD PT72
MEDICATED_PATCH | TRANSDERMAL | Status: AC
  Administered 2012-05-15: 1.5 mg via TRANSDERMAL
  Filled 2012-05-15: qty 1

## 2012-05-15 MED ORDER — SIMETHICONE 80 MG PO CHEW: 80.0000 mg | CHEWABLE_TABLET | ORAL | Status: DC | PRN

## 2012-05-15 MED ORDER — MIDAZOLAM HCL 2 MG/2ML IJ SOLN: 0.5000 mg | Freq: Once | INTRAMUSCULAR | Status: DC | PRN

## 2012-05-15 MED ORDER — CEFAZOLIN SODIUM-DEXTROSE 2-3 GM-% IV SOLR
INTRAVENOUS | Status: AC
  Administered 2012-05-15: 2 g via INTRAVENOUS
  Filled 2012-05-15: qty 50

## 2012-05-15 MED ORDER — SIMETHICONE 80 MG PO CHEW
80.0000 mg | CHEWABLE_TABLET | Freq: Three times a day (TID) | ORAL | Status: DC
  Administered 2012-05-15 – 2012-05-18 (×9): 80 mg via ORAL

## 2012-05-15 MED ORDER — SCOPOLAMINE 1 MG/3DAYS TD PT72
1.0000 | MEDICATED_PATCH | Freq: Once | TRANSDERMAL | Status: DC
  Administered 2012-05-15: 1.5 mg via TRANSDERMAL

## 2012-05-15 SURGICAL SUPPLY — 31 items
ADH SKN CLS APL DERMABOND .7 (GAUZE/BANDAGES/DRESSINGS) ×1
CLOTH BEACON ORANGE TIMEOUT ST (SAFETY) ×2 IMPLANT
DERMABOND ADVANCED (GAUZE/BANDAGES/DRESSINGS) ×1
DERMABOND ADVANCED .7 DNX12 (GAUZE/BANDAGES/DRESSINGS) ×1 IMPLANT
DRAPE SURG 17X23 STRL (DRAPES) ×2 IMPLANT
DRSG COVADERM 4X10 (GAUZE/BANDAGES/DRESSINGS) IMPLANT
DURAPREP 26ML APPLICATOR (WOUND CARE) ×2 IMPLANT
ELECT REM PT RETURN 9FT ADLT (ELECTROSURGICAL) ×2
ELECTRODE REM PT RTRN 9FT ADLT (ELECTROSURGICAL) ×1 IMPLANT
EXTRACTOR VACUUM M CUP 4 TUBE (SUCTIONS) IMPLANT
GLOVE BIO SURGEON STRL SZ 6.5 (GLOVE) ×2 IMPLANT
GLOVE BIOGEL PI IND STRL 7.0 (GLOVE) ×2 IMPLANT
GLOVE BIOGEL PI INDICATOR 7.0 (GLOVE) ×2
GOWN PREVENTION PLUS LG XLONG (DISPOSABLE) ×6 IMPLANT
KIT ABG SYR 3ML LUER SLIP (SYRINGE) ×2 IMPLANT
NDL HYPO 25X5/8 SAFETYGLIDE (NEEDLE) ×1 IMPLANT
NEEDLE HYPO 25X5/8 SAFETYGLIDE (NEEDLE) ×2 IMPLANT
NS IRRIG 1000ML POUR BTL (IV SOLUTION) ×2 IMPLANT
PACK C SECTION WH (CUSTOM PROCEDURE TRAY) ×2 IMPLANT
PAD OB MATERNITY 4.3X12.25 (PERSONAL CARE ITEMS) IMPLANT
SLEEVE SCD COMPRESS KNEE MED (MISCELLANEOUS) ×1 IMPLANT
STAPLER VISISTAT 35W (STAPLE) IMPLANT
SUT CHROMIC 0 CT 802H (SUTURE) IMPLANT
SUT CHROMIC 0 CTX 36 (SUTURE) ×6 IMPLANT
SUT MON AB-0 CT1 36 (SUTURE) ×2 IMPLANT
SUT PDS AB 0 CTX 60 (SUTURE) ×2 IMPLANT
SUT PLAIN 0 NONE (SUTURE) IMPLANT
SUT VIC AB 4-0 KS 27 (SUTURE) ×1 IMPLANT
TOWEL OR 17X24 6PK STRL BLUE (TOWEL DISPOSABLE) ×4 IMPLANT
TRAY FOLEY CATH 14FR (SET/KITS/TRAYS/PACK) IMPLANT
WATER STERILE IRR 1000ML POUR (IV SOLUTION) ×2 IMPLANT

## 2012-05-15 NOTE — H&P (Addendum)
26 yo G2P1 @ 39 wks presents for rpt c-section and sterilization.  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    Past History - see hollister  Prior c-section  Latex allergy  AF, VSS Gen - NAD CV - RRR Lungs - clear Abd - gravid, NT Ext - NT  A/P:  Desires repeat c-section with sterilization Rpt c-section w/ BPS R/b/a discussed, informed consent.

## 2012-05-15 NOTE — Op Note (Signed)
Cesarean Section Procedure Note   [name]  05/15/2012  Indications: Scheduled Proceedure/Maternal Request , desires sterilization  Pre-operative Diagnosis: PREVIOUS.   Post-operative Diagnosis: and desires sterilization   Surgeon: Surgeon(s) and Role:    * Zelphia Cairo, MD - Primary   Assistants: none  Anesthesia: spinal   Procedure:  Repeat c-section, bilateral partial salpingectomy  Procedure Details:  The patient was seen in the Holding Room. The risks, benefits, complications, treatment options, and expected outcomes were discussed with the patient. The patient concurred with the proposed plan, giving informed consent. identified as [name] and the procedure verified as C-Section Delivery. A Time Out was held and the above information confirmed.  After induction of anesthesia, the patient was draped and prepped in the usual sterile manner. A transverse was made and carried down through the subcutaneous tissue to the fascia. Fascial incision was made and extended transversely. The fascia was separated from the underlying rectus tissue superiorly and inferiorly. The peritoneum was identified and entered. Peritoneal incision was extended longitudinally. The utero-vesical peritoneal reflection was incised transversely and the bladder flap was bluntly freed from the lower uterine segment. A low transverse uterine incision was made. Delivered from cephalic presentation was a female infant with Apgar scores of 9 at one minute and 9 at five minutes. Cord ph was not sent the umbilical cord was clamped and cut cord blood was obtained for evaluation. The placenta was removed Intact and appeared normal. The uterine outline, tubes and ovaries appeared normal}. The uterine incision was closed with running locked sutures of 0chromic gut.   Hemostasis was observed. Lavage was carried out until clear.   Right fallopian tube was grasped with a babcock.  A knuckle of fallopian tube  was doubly tied with plain gut suture.  The knuckle was excised and handed off to be sent for pathology.  Procedure was repeated on the left fallopian tube.  Hemostasis was observed bilaterally.  Peritoneum was closed with 0 monocryl.  The fascia was then reapproximated with running sutures of 0PDS.  The skin was closed with 4-0Vicryl.   Instrument, sponge, and needle counts were correct prior the abdominal closure and were correct at the conclusion of the case.    Findings:   Estimated Blood Loss: * No blood loss amount entered *   Urine Output: clear  Specimens: none  Complications: no complications  Disposition: PACU - hemodynamically stable.   Maternal Condition: stable   Baby condition / location:  with mom  Attending Attestation: I was present and scrubbed for the entire procedure.   Signed: Surgeon(s): Zelphia Cairo, MD

## 2012-05-15 NOTE — Anesthesia Preprocedure Evaluation (Signed)
Anesthesia Evaluation  Patient identified by MRN, date of birth, ID band Patient awake    Reviewed: Allergy & Precautions, H&P , NPO status , Patient's Chart, lab work & pertinent test results  Airway Mallampati: II      Dental No notable dental hx.    Pulmonary neg pulmonary ROS,  breath sounds clear to auscultation  Pulmonary exam normal       Cardiovascular Exercise Tolerance: Good negative cardio ROS  Rhythm:regular Rate:Normal     Neuro/Psych Seizures -,  negative neurological ROS  negative psych ROS   GI/Hepatic negative GI ROS, Neg liver ROS,   Endo/Other  negative endocrine ROSdiabetes, Gestational  Renal/GU negative Renal ROS  negative genitourinary   Musculoskeletal   Abdominal Normal abdominal exam  (+)   Peds  Hematology negative hematology ROS (+)   Anesthesia Other Findings GDM (gestational diabetes mellitus)     Seizures 2005 side effect of wellbutrin-d/c   Reproductive/Obstetrics (+) Pregnancy                           Anesthesia Physical Anesthesia Plan  ASA: III  Anesthesia Plan: Spinal   Post-op Pain Management:    Induction:   Airway Management Planned:   Additional Equipment:   Intra-op Plan:   Post-operative Plan:   Informed Consent: I have reviewed the patients History and Physical, chart, labs and discussed the procedure including the risks, benefits and alternatives for the proposed anesthesia with the patient or authorized representative who has indicated his/her understanding and acceptance.     Plan Discussed with: Anesthesiologist, CRNA and Surgeon  Anesthesia Plan Comments:         Anesthesia Quick Evaluation

## 2012-05-15 NOTE — Anesthesia Postprocedure Evaluation (Signed)
Anesthesia Post Note  Patient: [name]  Procedure(s) Performed: Procedure(s) (LRB): CESAREAN SECTION WITH BILATERAL TUBAL LIGATION (Bilateral)  Anesthesia type: Spinal  Patient location: PACU  Post pain: Pain level controlled  Post assessment: Post-op Vital signs reviewed  Last Vitals:  Filed Vitals:   05/15/12 1547  BP:   Pulse: 70  Temp: 36.8 C  Resp: 14    Post vital signs: Reviewed  Level of consciousness: awake  Complications: No apparent anesthesia complications

## 2012-05-15 NOTE — Anesthesia Procedure Notes (Signed)
Spinal  Patient location during procedure: OR Start time: 05/15/2012 1:19 PM Staffing Anesthesiologist: Brayton Caves R Performed by: anesthesiologist  Preanesthetic Checklist Completed: patient identified, site marked, surgical consent, pre-op evaluation, timeout performed, IV checked, risks and benefits discussed and monitors and equipment checked Spinal Block Patient position: sitting Prep: DuraPrep Patient monitoring: heart rate, cardiac monitor, continuous pulse ox and blood pressure Approach: midline Location: L3-4 Injection technique: single-shot Needle Needle type: Sprotte  Needle gauge: 24 G Needle length: 9 cm Assessment Sensory level: T4 Additional Notes Patient identified.  Risk benefits discussed including failed block, incomplete pain control, headache, nerve damage, paralysis, blood pressure changes, nausea, vomiting, reactions to medication both toxic or allergic, and postpartum back pain.  Patient expressed understanding and wished to proceed.  All questions were answered.  Sterile technique used throughout procedure.  CSF was clear.  No parasthesia or other complications.  Please see nursing notes for vital signs.

## 2012-05-15 NOTE — Transfer of Care (Signed)
Immediate Anesthesia Transfer of Care Note  Patient: [name]  Procedure(s) Performed: Procedure(s) (LRB) with comments: CESAREAN SECTION WITH BILATERAL TUBAL LIGATION (Bilateral) - Repeat edc 05/22/12  Patient Location: PACU  Anesthesia Type: Spinal  Level of Consciousness: awake, alert , oriented and patient cooperative  Airway & Oxygen Therapy: Patient Spontanous Breathing  Post-op Assessment: Report given to PACU RN and Post -op Vital signs reviewed and stable  Post vital signs: Reviewed and stable  Complications: No apparent anesthesia complications

## 2012-05-15 NOTE — Preoperative (Signed)
Beta Blockers   Reason not to administer Beta Blockers:Not Applicable 

## 2012-05-16 LAB — BIRTH TISSUE RECOVERY COLLECTION (PLACENTA DONATION)

## 2012-05-16 LAB — CBC
MCH: 28.2 pg (ref 26.0–34.0)
Platelets: 163 10*3/uL (ref 150–400)
RBC: 2.98 MIL/uL — ABNORMAL LOW (ref 3.87–5.11)

## 2012-05-16 MED ORDER — BISACODYL 10 MG RE SUPP
10.0000 mg | Freq: Once | RECTAL | Status: AC
  Administered 2012-05-16: 10 mg via RECTAL
  Filled 2012-05-16: qty 1

## 2012-05-16 NOTE — Progress Notes (Signed)
Subjective: Postpartum Day 1: Cesarean Delivery Patient reports incisional pain, tolerating PO, + flatus and no problems voiding.    Objective: Vital signs in last 24 hours: Temp:  [97.3 F (36.3 C)-99.2 F (37.3 C)] 97.3 F (36.3 C) (10/04 0413) Pulse Rate:  [65-106] 74  (10/04 0413) Resp:  [14-21] 18  (10/04 0413) BP: (93-133)/(56-79) 115/73 mmHg (10/04 0413) SpO2:  [96 %-100 %] 96 % (10/04 0413) Weight:  [77.111 kg (170 lb)] 77.111 kg (170 lb) (10/03 1458)  Physical Exam:  General: alert, cooperative and appears stated age Lochia: appropriate Uterine Fundus: firm Incision: healing well, no significant drainage, no dehiscence, no significant erythema DVT Evaluation: No evidence of DVT seen on physical exam.   Basename 05/16/12 0510  HGB 8.4*  HCT 25.7*    Assessment/Plan: Status post Cesarean section. Doing well postoperatively.  Continue current care.  Davon Abdelaziz L 05/16/2012, 8:39 AM

## 2012-05-17 NOTE — Progress Notes (Signed)
Subjective: Postpartum Day 2: Cesarean Delivery Patient reports incisional pain, tolerating PO, + flatus and no problems voiding.    Objective: Vital signs in last 24 hours: Temp:  [97.4 F (36.3 C)-98.2 F (36.8 C)] 98.2 F (36.8 C) (10/05 0511) Pulse Rate:  [66-90] 90  (10/05 0511) Resp:  [18] 18  (10/05 0511) BP: (106-137)/(61-87) 137/87 mmHg (10/05 0511)  Physical Exam:  General: alert, cooperative and appears stated age Lochia: appropriate Uterine Fundus: firm Incision: healing well, no significant drainage, no dehiscence, no significant erythema DVT Evaluation: No evidence of DVT seen on physical exam.   Basename 05/16/12 0510  HGB 8.4*  HCT 25.7*    Assessment/Plan: Status post Cesarean section. Doing well postoperatively.  Continue current care.  Lynnleigh Soden L 05/17/2012, 6:42 AM

## 2012-05-18 MED ORDER — OXYCODONE-ACETAMINOPHEN 5-325 MG PO TABS: 1.0000 | ORAL_TABLET | ORAL | Status: DC | PRN

## 2012-05-18 MED ORDER — IBUPROFEN 600 MG PO TABS: 600.0000 mg | ORAL_TABLET | Freq: Four times a day (QID) | ORAL | Status: DC

## 2012-05-18 NOTE — Progress Notes (Signed)
Subjective: Postpartum Day 3: Cesarean Delivery Patient reports incisional pain, tolerating PO and no problems voiding.    Objective: Vital signs in last 24 hours: Temp:  [97.9 F (36.6 C)-98.7 F (37.1 C)] 97.9 F (36.6 C) (10/06 0554) Pulse Rate:  [81-96] 81  (10/06 0554) Resp:  [18] 18  (10/06 0554) BP: (103-128)/(78-85) 128/85 mmHg (10/06 0554) SpO2:  [96 %] 96 % (10/05 1453)  Physical Exam:  General: alert, cooperative and appears stated age Lochia: appropriate Uterine Fundus: firm Incision: healing well, no significant drainage, no dehiscence, no significant erythema DVT Evaluation: No evidence of DVT seen on physical exam.   Basename 05/16/12 0510  HGB 8.4*  HCT 25.7*    Assessment/Plan: Status post Cesarean section. Doing well postoperatively.  Discharge home with standard precautions and return to clinic in 2 weeks.  [name] 05/18/2012, 7:03 AM

## 2012-05-18 NOTE — Discharge Summary (Signed)
Obstetric Discharge Summary Reason for Admission: cesarean section Prenatal Procedures: none Intrapartum Procedures: cesarean: low cervical, transverse and tubal ligation Postpartum Procedures: none Complications-Operative and Postpartum: none Hemoglobin  Date Value Range Status  05/16/2012 8.4* 12.0 - 15.0 g/dL Final     HCT  Date Value Range Status  05/16/2012 25.7* 36.0 - 46.0 % Final    Physical Exam:  General: alert, cooperative and appears stated age 26: appropriate Uterine Fundus: firm Incision: healing well, no significant drainage, no dehiscence, no significant erythema DVT Evaluation: No evidence of DVT seen on physical exam.  Discharge Diagnoses: Term Pregnancy-delivered  Discharge Information: Date: 05/18/2012 Activity: pelvic rest Diet: routine Medications: Ibuprofen and Percocet Condition: improved Instructions: refer to practice specific booklet Discharge to: home   Newborn Data: Live born female  Birth Weight: 8 lb 1.6 oz (3675 g) APGAR: 9, 9  Home with mother.  Bay Jarquin L 05/18/2012, 7:03 AM

## 2013-03-03 ENCOUNTER — Encounter: Payer: Self-pay | Admitting: Emergency Medicine

## 2013-03-03 ENCOUNTER — Ambulatory Visit (INDEPENDENT_AMBULATORY_CARE_PROVIDER_SITE_OTHER): Payer: Self-pay | Admitting: Emergency Medicine

## 2013-03-03 VITALS — BP 112/80 | HR 87 | Temp 98.2°F | Resp 16 | Ht 62.5 in | Wt 150.8 lb

## 2013-03-03 DIAGNOSIS — K3189 Other diseases of stomach and duodenum: Secondary | ICD-10-CM

## 2013-03-03 DIAGNOSIS — R5381 Other malaise: Secondary | ICD-10-CM

## 2013-03-03 DIAGNOSIS — K219 Gastro-esophageal reflux disease without esophagitis: Secondary | ICD-10-CM | POA: Insufficient documentation

## 2013-03-03 DIAGNOSIS — R1013 Epigastric pain: Secondary | ICD-10-CM

## 2013-03-03 DIAGNOSIS — R21 Rash and other nonspecific skin eruption: Secondary | ICD-10-CM

## 2013-03-03 LAB — CBC
HCT: 35 % — ABNORMAL LOW (ref 36.0–46.0)
MCHC: 36.3 g/dL — ABNORMAL HIGH (ref 30.0–36.0)
MCV: 85.2 fL (ref 78.0–100.0)
Platelets: 239 10*3/uL (ref 150–400)
RDW: 12.5 % (ref 11.5–15.5)
WBC: 5.1 10*3/uL (ref 4.0–10.5)

## 2013-03-03 MED ORDER — RANITIDINE HCL 150 MG PO TABS: 150.0000 mg | ORAL_TABLET | Freq: Two times a day (BID) | ORAL | Status: DC

## 2013-03-03 NOTE — Progress Notes (Signed)
  Subjective:    Patient ID: [name], female    DOB: 07/10/1986, 27 y.o.   MRN: [id number]  HPI  27 year old female presents with fatigue.  Has a history of thyroid problems and also had gestational diabetes while pregnant.  Eats healthy and exercises.  Cannot loose the weight.  weightloss is her main concern.  She is a stay at home mom.  No sign of sleep apnea.  Has difficulty sleeping.  Falls asleep fine but cannot stay asleep.  Husband does say she snores.  Has a 21 year old and a 12 old.  Acid reflux has not gotten better since giving birth.  She does have some abdominal pain but does not eat fried foods.  No family history of gall stones.  Has a bad cough at night and a raspy voice.  Has taken otc nexium but not working.  Takes nexium every morning and takes tums at night.  Breast feeding three times a day.  Older child did not breast feed,      Review of Systems     Objective:   Physical Exam HEENT exam is unremarkable neck supple chest clear heart regular rate no murmurs abdomen soft liver spleen not enlarged there is no tenderness. There's a scaly rash over both elbows.  KOH positive for hyphae      Assessment & Plan:  GI referral has been made. She will be on Zantac 150 twice a day. Ultrasound of the abdomen will be obtained to look at the gallbladder. She is to use Selsun Blue shampoo to apply to her elbows for 10 minutes and then shower off tonight

## 2013-03-03 NOTE — Patient Instructions (Signed)
Esophagitis  Esophagitis is inflammation of the esophagus. It can involve swelling, soreness, and pain in the esophagus. This condition can make it difficult and painful to swallow.  CAUSES   Most causes of esophagitis are not serious. Many different factors can cause esophagitis, including:   Gastroesophageal reflux disease (GERD). This is when acid from your stomach flows up into the esophagus.   Recurrent vomiting.   An allergic-type reaction.   Certain medicines, especially those that come in large pills.   Ingestion of harmful chemicals, such as household cleaning products.   Heavy alcohol use.   An infection of the esophagus.   Radiation treatment for cancer.   Certain diseases such as sarcoidosis, Crohn's disease, and scleroderma. These diseases may cause recurrent esophagitis.  SYMPTOMS    Trouble swallowing.   Painful swallowing.   Chest pain.   Difficulty breathing.   Nausea.   Vomiting.   Abdominal pain.  DIAGNOSIS   Your caregiver will take your history and do a physical exam. Depending upon what your caregiver finds, certain tests may also be done, including:   Barium X-ray. You will drink a solution that coats the esophagus, and X-rays will be taken.   Endoscopy. A lighted tube is put down the esophagus so your caregiver can examine the area.   Allergy tests. These can sometimes be arranged through follow-up visits.  TREATMENT   Treatment will depend on the cause of your esophagitis. In some cases, steroids or other medicines may be given to help relieve your symptoms or to treat the underlying cause of your condition. Medicines that may be recommended include:   Viscous lidocaine, to soothe the esophagus.   Antacids.   Acid reducers.   Proton pump inhibitors.   Antiviral medicines for certain viral infections of the esophagus.   Antifungal medicines for certain fungal infections of the esophagus.   Antibiotic medicines, depending on the cause of the esophagitis.  HOME CARE  INSTRUCTIONS    Avoid foods and drinks that seem to make your symptoms worse.   Eat small, frequent meals instead of large meals.   Avoid eating for the 3 hours prior to your bedtime.   If you have trouble taking pills, use a pill splitter to decrease the size and likelihood of the pill getting stuck or injuring the esophagus on the way down. Drinking water after taking a pill also helps.   Stop smoking if you smoke.   Maintain a healthy weight.   Wear loose-fitting clothing. Do not wear anything tight around your waist that causes pressure on your stomach.   Raise the head of your bed 6 to 8 inches with wood blocks to help you sleep. Extra pillows will not help.   Only take over-the-counter or prescription medicines as directed by your caregiver.  SEEK IMMEDIATE MEDICAL CARE IF:   You have severe chest pain that radiates into your arm, neck, or jaw.   You feel sweaty, dizzy, or lightheaded.   You have shortness of breath.   You vomit blood.   You have difficulty or pain with swallowing.   You have bloody or black, tarry stools.   You have a fever.   You have a burning sensation in the chest more than 3 times a week for more than 2 weeks.   You cannot swallow, drink, or eat.   You drool because you cannot swallow your saliva.  MAKE SURE YOU:   Understand these instructions.   Will watch your condition.     Will get help right away if you are not doing well or get worse.  Document Released: 09/06/2004 Document Revised: 10/22/2011 Document Reviewed: 03/30/2011  Exit[employer] Patient Information 2014 [employer].

## 2013-03-04 LAB — COMPREHENSIVE METABOLIC PANEL
ALT: 9 U/L (ref 0–35)
AST: 19 U/L (ref 0–37)
Alkaline Phosphatase: 75 U/L (ref 39–117)
BUN: 12 mg/dL (ref 6–23)
Calcium: 8.9 mg/dL (ref 8.4–10.5)
Chloride: 104 mEq/L (ref 96–112)
Creat: 0.67 mg/dL (ref 0.50–1.10)
Potassium: 3.5 mEq/L (ref 3.5–5.3)

## 2013-03-04 LAB — T4, FREE: Free T4: 1.03 ng/dL (ref 0.80–1.80)

## 2013-03-05 LAB — HELICOBACTER PYLORI  ANTIBODY, IGM: Helicobacter pylori, IgM: 4 U/mL (ref <9.0)

## 2013-03-06 ENCOUNTER — Encounter: Payer: Self-pay | Admitting: Family Medicine

## 2013-03-11 ENCOUNTER — Ambulatory Visit
Admission: RE | Admit: 2013-03-11 | Discharge: 2013-03-11 | Disposition: A | Discharge status: Home / Self Care | Payer: 59 | Source: Ambulatory Visit | Attending: Emergency Medicine | Admitting: Emergency Medicine

## 2013-03-11 DIAGNOSIS — R1013 Epigastric pain: Secondary | ICD-10-CM

## 2013-09-15 ENCOUNTER — Encounter: Payer: Self-pay | Admitting: Emergency Medicine

## 2013-09-15 ENCOUNTER — Ambulatory Visit (INDEPENDENT_AMBULATORY_CARE_PROVIDER_SITE_OTHER): Payer: 59 | Admitting: Emergency Medicine

## 2013-09-15 VITALS — BP 120/80 | HR 97 | Temp 99.9°F | Resp 16 | Ht 63.0 in | Wt 157.8 lb

## 2013-09-15 DIAGNOSIS — E049 Nontoxic goiter, unspecified: Secondary | ICD-10-CM | POA: Insufficient documentation

## 2013-09-15 DIAGNOSIS — F3289 Other specified depressive episodes: Secondary | ICD-10-CM

## 2013-09-15 DIAGNOSIS — F32A Depression, unspecified: Secondary | ICD-10-CM

## 2013-09-15 DIAGNOSIS — Z733 Stress, not elsewhere classified: Secondary | ICD-10-CM

## 2013-09-15 DIAGNOSIS — F329 Major depressive disorder, single episode, unspecified: Secondary | ICD-10-CM

## 2013-09-15 DIAGNOSIS — F439 Reaction to severe stress, unspecified: Secondary | ICD-10-CM

## 2013-09-15 MED ORDER — SERTRALINE HCL 50 MG PO TABS: ORAL_TABLET | ORAL | Status: DC

## 2013-09-15 MED ORDER — LORAZEPAM 0.5 MG PO TABS: ORAL_TABLET | ORAL | Status: DC

## 2013-09-15 NOTE — Progress Notes (Signed)
Subjective:    Patient ID: Natalie DashElizabeth A Lynn, female    DOB: 05/14/1986, 28 y.o.   MRN: [id number] This chart was scribed for Collene GobbleSteven A Meaghen Vecchiarelli, MD by Danella Maiersaroline Early, ED Scribe. This patient was seen in room 22 and the patient's care was started at 10:00 AM.  Chief Complaint  Patient presents with  . would like to discuss going on medication Zoloft/Ativan    HPI HPI Comments: Natalie Dashlizabeth A Lynn is a 28 y.o. female with a h/o anxiety who presents to the [facility] complaining of stress and anxiety. Pt reports feeling overwhelmed dealing with her son Aiden going back and forth between herself and Aiden's father. She reports Aiden's father is very difficult to deal with. She has a Clinical research associatelawyer and is trying to get full custody. She is remarried to a Emergency planning/management officerpolice officer who is very supportive but works a lot. Aiden is 27 years old. She sees a Veterinary surgeoncounselor, Harle StanfordBarbara Metz, who she has been seeing since she was 16. She has been on Zoloft and Ativan in the past. She denies feeling depressed or SI. She had a tubal ligation.   She also reports difficulty staying asleep and early morning awakening. She denies trouble falling asleep.   PCP - Lucilla EdinAUB, STEVE A, MD   Past Medical History  Diagnosis Date  . GDM (gestational diabetes mellitus)   . Seizures 2005    side effect of wellbutrin-d/c   Current Outpatient Prescriptions on File Prior to Visit  Medication Sig Dispense Refill  . ranitidine (ZANTAC) 150 MG tablet Take 1 tablet (150 mg total) by mouth 2 (two) times daily.  60 tablet  5  . ibuprofen (ADVIL,MOTRIN) 600 MG tablet Take 1 tablet (600 mg total) by mouth every 6 (six) hours.  60 tablet  0  . oxyCODONE-acetaminophen (PERCOCET/ROXICET) 5-325 MG per tablet Take 1-2 tablets by mouth every 4 (four) hours as needed (moderate - severe pain).  30 tablet  0  . prenatal vitamin w/FE, FA (NATACHEW) 29-1 MG CHEW Chew 1 tablet by mouth daily.       No current facility-administered medications on file  prior to visit.   Allergies  Allergen Reactions  . Amoxicillin     Swelling/hives  . Latex Swelling and Rash      Review of Systems  Constitutional: Negative for fatigue and unexpected weight change.  Respiratory: Negative for chest tightness and shortness of breath.   Cardiovascular: Negative for chest pain, palpitations and leg swelling.  Gastrointestinal: Negative for abdominal pain and blood in stool.  Neurological: Negative for dizziness, syncope, light-headedness and headaches.  Psychiatric/Behavioral: Negative for suicidal ideas. The patient is nervous/anxious.        Objective:   Physical Exam CONSTITUTIONAL: Well developed/well nourished HEAD: Normocephalic/atraumatic EYES: EOMI/PERRL ENMT: Mucous membranes moist NECK: supple no meningeal signs SPINE:entire spine nontender CV: S1/S2 noted, no murmurs/rubs/gallops noted LUNGS: Lungs are clear to auscultation bilaterally, no apparent distress ABDOMEN: soft, nontender, no rebound or guarding GU:no cva tenderness NEURO: Pt is awake/alert, moves all extremitiesx4 EXTREMITIES: pulses normal, full ROM SKIN: warm, color normal PSYCH: no abnormalities of mood noted  Filed Vitals:   09/15/13 0954  BP: 120/80  Pulse: 97  Temp: 99.9 F (37.7 C)  TempSrc: Oral  Resp: 16  Height: 5\' 3"  (1.6 m)  Weight: 157 lb 12.8 oz (71.578 kg)  SpO2: 98%         Assessment & Plan:   1. Stress    patient under significant  amount of stress. She does have a counselor she sees regular Harle Stanford. I advise she speak with her about getting a marital counselor. We'll start Zoloft 50 mg half tablet for the first week and then one tablet daily. She is given Ativan 0.5 to take one at bedtime and another one during the day if she is especially stressed. I told her she was not a candidate for weight loss medications. I encouraged her to continue exercising and to go with her mom to the gym. I will see her in 4-6 weeks to check on progress.  She has a almost 57-year-old and a six-year-old and there are custody issues with the six-year-old with her ex-husband.

## 2013-09-15 NOTE — Patient Instructions (Signed)
Please talk to your  Therapist about  marriage counseling. Please see if he can join a support group. I will followup in 4-6 weeks to make sure you are improving

## 2013-10-27 ENCOUNTER — Ambulatory Visit (INDEPENDENT_AMBULATORY_CARE_PROVIDER_SITE_OTHER): Payer: 59 | Admitting: Emergency Medicine

## 2013-10-27 ENCOUNTER — Encounter: Payer: Self-pay | Admitting: Emergency Medicine

## 2013-10-27 VITALS — BP 129/87 | HR 106 | Temp 98.2°F | Resp 16 | Ht 62.75 in | Wt 155.4 lb

## 2013-10-27 DIAGNOSIS — R21 Rash and other nonspecific skin eruption: Secondary | ICD-10-CM

## 2013-10-27 DIAGNOSIS — F439 Reaction to severe stress, unspecified: Secondary | ICD-10-CM

## 2013-10-27 DIAGNOSIS — Z733 Stress, not elsewhere classified: Secondary | ICD-10-CM

## 2013-10-27 MED ORDER — KETOCONAZOLE 2 % EX CREA: 1.0000 "application " | TOPICAL_CREAM | Freq: Every day | CUTANEOUS | Status: DC

## 2013-10-27 MED ORDER — LORAZEPAM 1 MG PO TABS: ORAL_TABLET | ORAL | Status: DC

## 2013-10-27 NOTE — Progress Notes (Addendum)
  This chart was scribed for Collene GobbleSteven A Aubrynn Katona, MD by Joaquin MusicKristina Sanchez-Matthews, ED Scribe. This patient was seen in room Room/bed 23 and the patient's care was started at 1:16 PM. Subjective:    Patient ID: Natalie DashElizabeth A Lynn, female    DOB: [birth year], 28 y.o.   MRN: [id number]  HPI [name] is a 28 y.o. female who [facility]. Pt states she has noticed a difference since taking Zoloft 50 during the day and reports not being able to sleep still. Pts husband states pts anxiety has been manageable. She denies taking the Adavan daily and states she takes this once a week PRN.  Pt is seeking private marriage counseling at this moment with her husband.   Review of Systems  Psychiatric/Behavioral: The patient is not nervous/anxious.    Objective:   Physical Exam CONSTITUTIONAL: Well developed/well nourished HEAD: Normocephalic/atraumatic EYES: EOMI/PERRL ENMT: Mucous membranes moist NECK: supple no meningeal signs SPINE:entire spine nontender CV: S1/S2 noted, no murmurs/rubs/gallops noted LUNGS: Lungs are clear to auscultation bilaterally, no apparent distress ABDOMEN: soft, nontender, no rebound or guarding GU:no cva tenderness NEURO: Pt is awake/alert, moves all extremitiesx4 EXTREMITIES: pulses normal, full ROM SKIN: warm, color normal PSYCH: no abnormalities of mood noted   BP 129/87  Pulse 106  Temp(Src) 98.2 F (36.8 C) (Oral)  Resp 16  Ht 5' 2.75" (1.594 m)  Wt 155 lb 6.4 oz (70.489 kg)  BMI 27.74 kg/m2  SpO2 98%  LMP 10/02/2013 Assessment & Plan:  The patient is not sleeping well at night. We'll increase Ativan  To 1 mg at bedtime continue Zoloft 50 a day. They are going to discuss marriage counseling with Fenton MallingJoel Alvarez the  EAP at [facility]. Once we get a name I will make the referral .  I personally performed the services described in this documentation, which was scribed in my presence. The recorded information has been reviewed and is  accurate.

## 2014-06-14 ENCOUNTER — Encounter: Payer: Self-pay | Admitting: Emergency Medicine

## 2014-11-10 ENCOUNTER — Ambulatory Visit (INDEPENDENT_AMBULATORY_CARE_PROVIDER_SITE_OTHER): Payer: 59 | Admitting: Family Medicine

## 2014-11-10 VITALS — BP 126/72 | HR 84 | Temp 97.9°F | Resp 14 | Ht 63.0 in | Wt 138.4 lb

## 2014-11-10 DIAGNOSIS — R404 Transient alteration of awareness: Secondary | ICD-10-CM

## 2014-11-10 LAB — COMPREHENSIVE METABOLIC PANEL
ALK PHOS: 36 U/L — AB (ref 39–117)
ALT: 13 U/L (ref 0–35)
AST: 17 U/L (ref 0–37)
Albumin: 4.4 g/dL (ref 3.5–5.2)
BUN: 7 mg/dL (ref 6–23)
CO2: 25 mEq/L (ref 19–32)
CREATININE: 0.48 mg/dL — AB (ref 0.50–1.10)
Calcium: 9.4 mg/dL (ref 8.4–10.5)
Chloride: 103 mEq/L (ref 96–112)
GLUCOSE: 86 mg/dL (ref 70–99)
Potassium: 4 mEq/L (ref 3.5–5.3)
Sodium: 138 mEq/L (ref 135–145)
Total Bilirubin: 0.6 mg/dL (ref 0.2–1.2)
Total Protein: 6.9 g/dL (ref 6.0–8.3)

## 2014-11-10 LAB — CBC
HEMATOCRIT: 37.7 % (ref 36.0–46.0)
Hemoglobin: 12.9 g/dL (ref 12.0–15.0)
MCH: 30.3 pg (ref 26.0–34.0)
MCHC: 34.2 g/dL (ref 30.0–36.0)
MCV: 88.5 fL (ref 78.0–100.0)
MPV: 10.7 fL (ref 8.6–12.4)
Platelets: 242 10*3/uL (ref 150–400)
RBC: 4.26 MIL/uL (ref 3.87–5.11)
RDW: 14.8 % (ref 11.5–15.5)
WBC: 9.2 10*3/uL (ref 4.0–10.5)

## 2014-11-10 LAB — TSH: TSH: 0.87 u[IU]/mL (ref 0.350–4.500)

## 2014-11-10 LAB — GLUCOSE, POCT (MANUAL RESULT ENTRY): POC GLUCOSE: 101 mg/dL — AB (ref 70–99)

## 2014-11-10 NOTE — Progress Notes (Signed)
[facility] [address], [place] [address] [phone]  Date:  11/10/2014   Name:  Natalie Dashlizabeth A Wake   DOB:  01/19/1986   MRN:  [id number]  PCP:  Lucilla EdinAUB, STEVE A, MD    Chief Complaint: Headache and Numbness   History of Present Illness:  [name] is a 29 y.o. very pleasant female patient who presents with the following:  This am she awoke and noted onset of some strange sx.  Around 0130 she awoke with nausea, went back to sleep and work back up at 7am with a very bad headache. She also noted bluriness of her vision and that her left hand went numb.  This numbness lasted 30 minutes, blurred vision for about an hour and her HA lasted about one hour.    No discrete weakness in any part of her body.  However she has felt tired, and still has nausea.    Her HA is now gone.  She did not take any medication for her HA.    This past Sunday she noted a similar episode while she was at church.  She felt like she could not move her mouth and felt "very overwhelmed and confused," and she had headache.  She was not talking because they were in church so she is not sure about slurred speech.  These sx cleared up on their own  No head injury recently.   She has been working out more recently and has improved her diet but she has not felt like her blood sugar was low She was being treated with wellbutrin about 12 years ago and had some seizures; these stopped when she stopped taking it. No other cause for her seizures found.    No family history of seizures.   She is generally in good health.    LMP about 10 days ago; she had a BTL so she is not pregnant She is married with 2 children at home She is a non -smoker Patient Active Problem List   Diagnosis Date Noted  . Depression 09/15/2013  . Goiter 09/15/2013  . GERD (gastroesophageal reflux disease) 03/03/2013    Past Medical History  Diagnosis Date  . GDM (gestational diabetes mellitus)   . Seizures  2005    side effect of wellbutrin-d/c    Past Surgical History  Procedure Laterality Date  . Cesarean section    . Cervical biopsy  w/ loop electrode excision      History  Substance Use Topics  . Smoking status: Never Smoker   . Smokeless tobacco: Not on file  . Alcohol Use: No    Family History  Problem Relation Age of Onset  . Cancer Other     Allergies  Allergen Reactions  . Amoxicillin     Swelling/hives  . Latex Swelling and Rash    Medication list has been reviewed and updated.  Current Outpatient Prescriptions on File Prior to Visit  Medication Sig Dispense Refill  . LORazepam (ATIVAN) 1 MG tablet Take one half to one tablet at bedtime (Patient not taking: Reported on 11/10/2014) 30 tablet 3  . sertraline (ZOLOFT) 50 MG tablet Take one half tablet daily for the first week and then one tablet daily (Patient not taking: Reported on 11/10/2014) 30 tablet 5   No current facility-administered medications on file prior to visit.    Review of Systems:  As per HPI- otherwise negative.   Physical Examination: Filed Vitals:   11/10/14 1307  BP: 126/72  Pulse: 84  Temp: 97.9 F (36.6 C)  Resp: 14   Filed Vitals:   11/10/14 1307  Height:  (1.6 m)  Weight: 138 lb 6.4 oz (62.778 kg)   Body mass index is 24.52 kg/(m^2). Ideal Body Weight: Weight in (lb) to have BMI = 25: 140.8  GEN: WDWN, NAD, Non-toxic, A & O x 3, looks well HEENT: Atraumatic, Normocephalic. Neck supple. No masses, No LAD.  Bilateral TM wnl, oropharynx normal.  PEERL,EOMI.   No meningismus Ears and Nose: No external deformity. CV: RRR, No M/G/R. No JVD. No thrill. No extra heart sounds. PULM: CTA B, no wheezes, crackles, rhonchi. No retractions. No resp. distress. No accessory muscle use. ABD: S, NT, ND, +BS. No rebound. No HSM.  Benign exam EXTR: No c/c/e NEURO Normal gait.  PSYCH: Normally interactive. Conversant. Not depressed or anxious appearing.  Calm demeanor.  Complete  neurological exam is WNL including strength, sensation and DTR all extremities, normal facial movement, negative romberg Results for orders placed or performed in visit on 11/10/14  POCT glucose (manual entry)  Result Value Ref Range   POC Glucose 101 (A) 70 - 99 mg/dl    Assessment and Plan: Transient alteration of awareness - Plan: POCT glucose (manual entry), CBC, Comprehensive metabolic panel, TSH  Await the rest of her labs. DDx includes seizures or MS.  Spoke with Dr. Vickey Huger who kindly agreed to see her tomorrow. In the meantime cautioned her to avoid driving as much as is possible.  She agreed  Signed Abbe Amsterdam, MD

## 2014-11-10 NOTE — Patient Instructions (Signed)
I will be in touch with the rest of your labs and will give you your appt time as soon as I receive it.    [facility] [address] [place], [place] [address] Tel: [phone]

## 2014-11-11 ENCOUNTER — Ambulatory Visit (INDEPENDENT_AMBULATORY_CARE_PROVIDER_SITE_OTHER): Payer: 59

## 2014-11-11 ENCOUNTER — Ambulatory Visit (INDEPENDENT_AMBULATORY_CARE_PROVIDER_SITE_OTHER): Payer: 59 | Admitting: Neurology

## 2014-11-11 ENCOUNTER — Ambulatory Visit: Payer: 59

## 2014-11-11 ENCOUNTER — Encounter: Payer: Self-pay | Admitting: Family Medicine

## 2014-11-11 ENCOUNTER — Encounter: Payer: Self-pay | Admitting: Neurology

## 2014-11-11 VITALS — BP 125/75 | HR 75 | Resp 14 | Ht 63.25 in | Wt 138.4 lb

## 2014-11-11 DIAGNOSIS — R404 Transient alteration of awareness: Secondary | ICD-10-CM | POA: Diagnosis not present

## 2014-11-11 DIAGNOSIS — R4789 Other speech disturbances: Secondary | ICD-10-CM | POA: Diagnosis not present

## 2014-11-11 DIAGNOSIS — R51 Headache: Secondary | ICD-10-CM | POA: Diagnosis not present

## 2014-11-11 DIAGNOSIS — R208 Other disturbances of skin sensation: Secondary | ICD-10-CM

## 2014-11-11 DIAGNOSIS — R519 Headache, unspecified: Secondary | ICD-10-CM

## 2014-11-11 DIAGNOSIS — Z0289 Encounter for other administrative examinations: Secondary | ICD-10-CM

## 2014-11-11 NOTE — Progress Notes (Signed)
Provider:  Melvyn Novasarmen  Bevan Lynn, M D  Referring Provider: Collene Gobbleaub, Natalie A, MD Primary Care Physician:  [name], Natalie A, MD  Chief Complaint  Patient presents with  . NP Headache/numbness/ blurred vision    Rm 11, husband    HPI:  [name] is Lynn 29 y.o. female , seen here as Lynn referral from Dr. Cleta Albertsaub for Lynn seizure evaluation.  [name] stated today that when she was 29 years old she had Lynn couple of seizures that were attributed to the use of Wellbutrin at the time. She never had any after discontinuing the medication and would not be considered epileptic, due to the provoked nature of the event.  Now at age 29 she presents with sensory abnormalities associated with nausea- the onset was in the middle of sleep at night. She awoke about 1:30 AM and felt severely nauseated but she could go back to sleep. When she again woke up at 7 AM her regular time to rise she had noticed Lynn very bad headache that she describes as dull and generalized nonfocal not sharp not stabbing and not throbbing. She also felt that her vision was blurred and her left hand was asleep, she. The numbness lasted about half an hour the blurred vision may have lasted longer as did the headache. She felt quite tired and still had latent nausea throughout the day following.   On Easter Sunday she experienced Lynn very different spell at church she felt that she could not move for mild and she felt Lynn sense of doom of confusion of feeling overwhelmed. This was followed by Lynn headache. Also she states she knew that she was in Lynn familiar church she was still not feeling as if the environment was the same as usual. She also stated that she had attempted to speak but couldn't get the words out. She has only one history of Lynn head injury this occurred when she was 29 years old and she had Lynn seizure while walking down Lynn driveway. Apparently her had fits Lynn cemented prednisone at the time she was not diagnosed with concussion or contusion. She  has no family history of seizures or epilepsy. Yesterday, 11-10-14, she took an Advil after visiting with her primary care physician, Dr. Dallas Lynn. She has not used any other over-the-counter remedy or prescription medication for her spells so far.  [name] had Lynn tubal ligation and is not on hormonal birth control. She has 2 children, the second pregnancy was associated with gas station of diabetes. Her son however was born per ceasarean  at normal weight and gestational age. She is Lynn nonsmoker, lifelong. She does rarely drink alcohol.  The patient is currently treated with Zoloft and takes at bedtime Ativan 1 mg. She has only one allergy to prescription medication,  Amoxicillin.    Review of Systems: Out of Lynn complete 14 system review, the patient complains of only the following symptoms, and all other reviewed systems are negative. Nausea , headache , blurred vision, dull HA>   History   Social History  . Marital Status: Married    Spouse Name: [name]  . Number of Children: 2  . Years of Education: 2 y collg   Occupational History  .      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 Smoking status: Never Smoker   . Smokeless tobacco: Not on file  . Alcohol Use: No  . Drug Use: No  .  Sexual Activity: Not on file   Other Topics Concern  . Not on file   Social History Narrative   Caffeine 1 cup daily.    Family History  Problem Relation Age of Onset  . Cancer Other   . Diverticulitis Father   . Alzheimer's disease Paternal Grandmother   . High Cholesterol Mother   . Pancreatic cancer Maternal Grandmother   . Colon cancer Paternal Grandfather     Past Medical History  Diagnosis Date  . GDM (gestational diabetes mellitus)   . 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 Depression   . Anxiety     Past Surgical History  Procedure Laterality Date  . Cesarean section  04-19-07, 05/15/12  . Cervical biopsy  w/ loop electrode excision  03/2011  .  Fracture surgery      Current Outpatient Prescriptions  Medication Sig Dispense Refill  . Magnesium 300 MG CAPS Take by mouth.  po daily    . pyridoxine (B-6) 100 MG tablet Take 100 mg by mouth daily.    Marland Kitchen LORazepam (ATIVAN) 1 MG tablet Take one half to one tablet at bedtime (Patient not taking: Reported on 11/10/2014) 30 tablet 3   No current facility-administered medications for this visit.    Allergies as of 11/11/2014 - Review Complete 11/11/2014  Allergen Reaction Noted  . Amoxicillin  09/15/2013  . Latex Swelling and Rash 10/16/2011    Vitals: BP 125/75 mmHg  Pulse 75  Resp 14  Ht 5' 3.25" (1.607 m)  Wt 138 lb 6.4 oz (62.778 kg)  BMI 24.31 kg/m2  LMP 10/27/2014 Last Weight:  Wt Readings from Last 1 Encounters:  11/11/14 138 lb 6.4 oz (62.778 kg)   Last Height:   Ht Readings from Last 1 Encounters:  11/11/14 5' 3.25" (1.607 m)    Physical exam:  General: The patient is awake, alert and appears not in acute distress. The patient is well groomed. Head: Normocephalic, atraumatic. Neck is supple. Mallampati 3 , neck circumference: 13.5  Cardiovascular:  Regular rate and rhythm , without  murmurs or carotid bruit, and without distended neck veins. Respiratory: Lungs are clear to auscultation. Skin:  Without evidence of edema, or rash Trunk: normal posture.  Neurologic exam : The patient is awake and alert, oriented to place and time.  Memory subjective described as intact. There is Lynn normal attention span & concentration ability. Speech is fluent without dysarthria, dysphonia or aphasia. Mood and affect are appropriate.  Cranial nerves: Pupils are equal and briskly reactive to light. Funduscopic exam without  evidence of pallor or edema. Extraocular movements  in vertical and horizontal planes intact and without nystagmus.  Visual fields by finger perimetry are intact. Hearing to finger rub intact.  Facial sensation intact to fine touch. Facial motor strength is  symmetric and tongue and uvula move midline. Tongue protrusion into either cheek is normal. Shoulder shrug is normal.   Motor exam: Tone , muscle bulk and symmetric strength in all extremities.  Sensory:  Fine touch, pinprick and vibration were tested in all extremities. Proprioception was normal.  Coordination: Rapid alternating movements in the fingers/hands were normal.  Finger-to-nose maneuver normal without evidence of ataxia, dysmetria or tremor.  Gait and station: Patient walks without assistive device and is able unassisted to climb up to the exam table.  Strength within normal limits. Stance is stable and normal. Tandem gait is unfragmented. Romberg testing is negative   Deep tendon reflexes: in the  upper  and lower extremities are symmetric and intact. Babinski maneuver response is downgoing.   Assessment:  After physical and neurologic examination, review of laboratory studies, imaging, neurophysiology testing and pre-existing records, assessment is that of :  Mrs. Lythgoe neurologic exam is completely nonfocal.  She has normal muscle tone, coordination there is no ataxia or dysmetria noted no tremor seen. She has equal strength in her upper and lower extremities she was able to perform Lynn tandem gait. Her Romberg was slightly unsteady,. 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 #3 would be Lynn TIA for which this patient has neither the history nor age and no other risk factors. #4 in Lynn female of childbearing age the sensory abnormalities such as whole sided facial numbness or arm numbness could be Lynn multiple sclerosis episode however her spell lasted barely in our there is no  recurrent remitting relapsing pattern. I will order an MRI of the brain and would like this performed ASAP hopefully today or tomorrow #2 I order an EEG that should last about 40 minutes this may not be able to be ordered today and she may have to return tomorrow afternoon. #3 I do not need any labs at this time Dr. Lawerance Cruel already ordered the battery. I explained to the patient that if MRI and EEG returned back as not abnormal in any way I would not restrain her from driving, operating machinery of any kind or returning to work.  Plan:  Treatment plan and additional workup :     Natalie Novas MD 11/11/2014

## 2014-11-11 NOTE — Patient Instructions (Signed)
Generalized Anxiety Disorder Generalized anxiety disorder (GAD) is a mental disorder. It interferes with life functions, including relationships, work, and school. GAD is different from normal anxiety, which everyone experiences at some point in their lives in response to specific life events and activities. Normal anxiety actually helps us prepare for and get through these life events and activities. Normal anxiety goes away after the event or activity is over.  GAD causes anxiety that is not necessarily related to specific events or activities. It also causes excess anxiety in proportion to specific events or activities. The anxiety associated with GAD is also difficult to control. GAD can vary from mild to severe. People with severe GAD can have intense waves of anxiety with physical symptoms (panic attacks).  SYMPTOMS The anxiety and worry associated with GAD are difficult to control. This anxiety and worry are related to many life events and activities and also occur more days than not for 6 months or longer. People with GAD also have three or more of the following symptoms (one or more in children):  Restlessness.   Fatigue.  Difficulty concentrating.   Irritability.  Muscle tension.  Difficulty sleeping or unsatisfying sleep. DIAGNOSIS GAD is diagnosed through an assessment by your health care provider. Your health care provider will ask you questions aboutyour mood,physical symptoms, and events in your life. Your health care provider may ask you about your medical history and use of alcohol or drugs, including prescription medicines. Your health care provider may also do a physical exam and blood tests. Certain medical conditions and the use of certain substances can cause symptoms similar to those associated with GAD. Your health care provider may refer you to a mental health specialist for further evaluation. TREATMENT The following therapies are usually used to treat GAD:    Medication. Antidepressant medication usually is prescribed for long-term daily control. Antianxiety medicines may be added in severe cases, especially when panic attacks occur.   Talk therapy (psychotherapy). Certain types of talk therapy can be helpful in treating GAD by providing support, education, and guidance. A form of talk therapy called cognitive behavioral therapy can teach you healthy ways to think about and react to daily life events and activities.  Stress managementtechniques. These include yoga, meditation, and exercise and can be very helpful when they are practiced regularly. A mental health specialist can help determine which treatment is best for you. Some people see improvement with one therapy. However, other people require a combination of therapies. Document Released: 11/24/2012 Document Revised: 12/14/2013 Document Reviewed: 11/24/2012 ExitCare Patient Information 2015 [employer]. This information is not intended to replace advice given to you by your health care provider. Make sure you discuss any questions you have with your health care provider.  

## 2014-11-11 NOTE — Progress Notes (Signed)
When she had her seizure on Wellbutrin it was an intentional OD  On about 10 pills as I recall. Thanks

## 2014-11-12 ENCOUNTER — Other Ambulatory Visit (INDEPENDENT_AMBULATORY_CARE_PROVIDER_SITE_OTHER): Payer: Self-pay | Admitting: Neurology

## 2014-11-12 DIAGNOSIS — R4789 Other speech disturbances: Secondary | ICD-10-CM | POA: Diagnosis not present

## 2014-11-12 DIAGNOSIS — R208 Other disturbances of skin sensation: Secondary | ICD-10-CM

## 2014-11-12 DIAGNOSIS — R51 Headache: Secondary | ICD-10-CM

## 2014-11-12 DIAGNOSIS — R519 Headache, unspecified: Secondary | ICD-10-CM

## 2014-11-12 DIAGNOSIS — R569 Unspecified convulsions: Secondary | ICD-10-CM

## 2014-11-12 DIAGNOSIS — R404 Transient alteration of awareness: Secondary | ICD-10-CM | POA: Diagnosis not present

## 2014-11-15 ENCOUNTER — Ambulatory Visit (INDEPENDENT_AMBULATORY_CARE_PROVIDER_SITE_OTHER): Payer: 59 | Admitting: Neurology

## 2014-11-15 ENCOUNTER — Encounter: Payer: Self-pay | Admitting: Neurology

## 2014-11-15 VITALS — BP 115/70 | HR 80 | Resp 12 | Ht 63.0 in | Wt 140.8 lb

## 2014-11-15 DIAGNOSIS — R4789 Other speech disturbances: Secondary | ICD-10-CM

## 2014-11-15 NOTE — Progress Notes (Signed)
Provider:  Melvyn Novasarmen  Chaunta Bejarano, M D  Referring Provider: Collene Gobbleaub, Steven A, MD Primary Care Physician:  Lucilla EdinAUB, STEVE A, MD  Chief Complaint  Patient presents with  . RV reveiew test results EEG/MRI    Rm 11, alone    HPI:  [name] is a 29 y.o. female , seen here as a referral from Dr. Cleta Albertsaub for a seizure evaluation.  [name] stated today that when she was 29 years old she had a couple of seizures that were attributed to the use of Wellbutrin at the time. She never had any after discontinuing the medication and would not be considered epileptic, due to the provoked nature of the event.  Now at age 29 she presents with sensory abnormalities associated with nausea- the onset was in the middle of sleep at night. She awoke about 1:30 AM and felt severely nauseated but she could go back to sleep. When she again woke up at 7 AM her regular time to rise she had noticed a very bad headache that she describes as dull and generalized nonfocal not sharp not stabbing and not throbbing. She also felt that her vision was blurred and her left hand was asleep, she. The numbness lasted about half an hour the blurred vision may have lasted longer as did the headache. She felt quite tired and still had latent nausea throughout the day following.  On Easter Sunday she experienced a very different spell at church she felt that she could not move for mild and she felt a sense of doom of confusion of feeling overwhelmed. This was followed by a headache. Also she states she knew that she was in a familiar church she was still not feeling as if the environment was the same as usual. She also stated that she had attempted to speak but couldn't get the words out. She has only one history of a head injury this occurred when she was 29 years old and she had a seizure while walking down a driveway. Apparently her had fits a cemented prednisone at the time she was not diagnosed with concussion or contusion. She has no  family history of seizures or epilepsy. Yesterday, 11-10-14, she took an Advil after visiting with her primary care physician, Dr. Dallas Schimkeopeland. She has not used any other over-the-counter remedy or prescription medication for her spells so far.  [name] had a tubal ligation and is not on hormonal birth control. She has 2 children, the second pregnancy was associated with gas station of diabetes. Her son however was born per ceasarean  at normal weight and gestational age. She is a nonsmoker, lifelong. She does rarely drink alcohol.  The patient is currently treated with Zoloft and takes at bedtime Ativan 1 mg. She has only one allergy to prescription medication,  Amoxicillin.  11-15-14 I me today with [name] her husband to review the EEG and MRI results. [name] EEG begun with a strong photosensitivity which we also see an migraine nurse. This is a photic entrainment went up to 17 Hz she reached sleep and she reached drowsiness 4. Several minutes during the 40 minute duration of the study. 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  However I cannot say that occipital sharp waves would be associated with nocturnal seizures to my knowledge.  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 I would recommend for the patient not to take Wellbutrin or its relatives ever again I also discussed with her that alcohol and sleep deprivation can lower the seizure threshold. MRI normal- arnold chiari  Mildest form, 2 -3 mm , not to worry.  , use Unisom to sleep,     Review of Systems: Out of a complete 14 system review, the patient complains of only the following symptoms, and all other reviewed systems are negative. Nausea , headache , blurred vision, dull HA>   History   Social History  . Marital Status: Married    Spouse Name: Neysa Bonito  .  Number of Children: 2  . Years of Education: 2 y collg   Occupational History  .      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 Smoking status: Never Smoker   . Smokeless tobacco: Not on file  . Alcohol Use: No  . Drug Use: No  . Sexual Activity: Not on file   Other Topics Concern  . Not on file   Social History Narrative   Caffeine 1 cup daily.    Family History  Problem Relation Age of Onset  . Cancer Other   . Diverticulitis Father   . Alzheimer's disease Paternal Grandmother   . High Cholesterol Mother   . Pancreatic cancer Maternal Grandmother   . Colon cancer Paternal Grandfather     Past Medical History  Diagnosis Date  . GDM (gestational diabetes mellitus)   . 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 Depression   . Anxiety     Past Surgical History  Procedure Laterality Date  . Cesarean section  04-19-07, 05/15/12  . Cervical biopsy  w/ loop electrode excision  03/2011  . Fracture surgery      Current Outpatient Prescriptions  Medication Sig Dispense Refill  . LORazepam (ATIVAN) 1 MG tablet Take one half to one tablet at bedtime 30 tablet 3  . Magnesium 300 MG CAPS Take by mouth.  po daily    . pyridoxine (B-6) 100 MG tablet Take 100 mg by mouth daily.     No current facility-administered medications for this visit.    Allergies as of 11/15/2014 - Review Complete 11/15/2014  Allergen Reaction Noted  . Amoxicillin  09/15/2013  . Latex Swelling and Rash 10/16/2011    Vitals: BP 115/70 mmHg  Pulse 80  Resp 12  Ht  (1.6 m)  Wt 140 lb 12.8 oz (63.866 kg)  BMI 24.95 kg/m2  LMP 10/27/2014 Last Weight:  Wt Readings from Last 1 Encounters:  11/15/14 140 lb 12.8 oz (63.866 kg)   Last Height:   Ht Readings from Last 1 Encounters:  11/15/14  (1.6 m)    Physical exam:  General: The patient is awake, alert and appears not in acute distress. The patient is well groomed. Head: Normocephalic, atraumatic. Neck  is supple. Mallampati 3 , neck circumference: 13.5  Cardiovascular:  Regular rate and rhythm , without  murmurs or carotid bruit, and without distended neck veins. Respiratory: Lungs are clear to auscultation. Skin:  Without evidence of edema, or rash Trunk: normal posture.  Neurologic exam : The patient is awake and alert, oriented to place and time.  Memory subjective described as intact. There is a normal attention span & concentration ability. Speech is fluent without dysarthria, dysphonia or aphasia. Mood and affect are appropriate.  Cranial nerves: Pupils  are equal and briskly reactive to light. Funduscopic exam without  evidence of pallor or edema. Extraocular movements  in vertical and horizontal planes intact and without nystagmus.  Visual fields by finger perimetry are intact. Hearing to finger rub intact.  Facial sensation intact to fine touch. Facial motor strength is symmetric and tongue and uvula move midline. Tongue protrusion into either cheek is normal. Shoulder shrug is normal.   Motor exam: Tone , muscle bulk and symmetric strength in all extremities.  Sensory:  Fine touch, pinprick and vibration were tested in all extremities. Proprioception was normal.  Coordination: Rapid alternating movements in the fingers/hands were normal.  Finger-to-nose maneuver normal without evidence of ataxia, dysmetria or tremor.  Gait and station: Patient walks without assistive device and is able unassisted to climb up to the exam table.  Strength within normal limits. Stance is stable and normal. Tandem gait is unfragmented. Romberg testing is negative   Deep tendon reflexes: in the  upper and lower extremities are symmetric and intact. Babinski maneuver response is downgoing.   Assessment:  After physical and neurologic examination, review of laboratory studies, imaging, neurophysiology testing and pre-existing records, assessment is that of :  Mrs. Millett neurologic exam is completely  nonfocal.  She has normal muscle tone, coordination there is no ataxia or dysmetria noted no tremor seen. She has equal strength in her upper and lower extremities she was able to perform a tandem gait. Her Romberg was slightly unsteady,. 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 #3 would be a TIA for which this patient has neither the history nor age and no other risk factors. #4 in a female of childbearing age the sensory abnormalities such as whole sided facial numbness or arm numbness could be a multiple sclerosis episode however her spell lasted barely in our there is no recurrent remitting relapsing pattern. I will order an MRI of the brain and would like this performed ASAP hopefully today or tomorrow #2 I order an EEG that should last about 40 minutes this may not be able to be ordered today and she may have to return tomorrow afternoon. #3 I do not need any labs at this time Dr. Dallas Schimke already ordered the battery. I explained to the patient that if MRI and EEG returned back as not abnormal in any way I would not restrain her from driving, operating machinery of any kind or returning to work.  Plan:  Treatment plan and additional workup :  None. Use Unisom, MRi official read is pending, EEG reviewed and dictated. No medication added.    RV; prn.     Porfirio Mylar Brendaly Townsel MD 11/15/2014

## 2014-11-16 DIAGNOSIS — R569 Unspecified convulsions: Secondary | ICD-10-CM | POA: Insufficient documentation

## 2014-11-16 NOTE — Procedures (Signed)
[facility]  EEG (ELECTROENCEPHALOGRAM) REPORT   STUDY DATE:  11-12-14  PATIENT NAME: [name], [name]  MRN: [id number] Date of birth 18-Mar-1986   ORDERING CLINICIAN: Melvyn Novasarmen Denis Carreon, MD   TECHNOLOGIST:  Caryn SectionFox  TECHNIQUE: Electroencephalogram was recorded utilizing standard 10-20 system of lead placement and reformatted into average and bipolar montages.    RECORDING TIME:  40.2 minutes ACTIVATION:  strobe lights and hyperventilation    CLINICAL INFORMATION:  29 year old female with a history of seizures at age 29 at the time seizure was likely related to a high dose of Wellbutrin, non-accidental overdose?Marland Kitchen. The patient now has spells of sensory abnormality associated with nausea facial numbness onset in the middle of the night. She is somewhat confused and sometimes has speech arrest after the spell.    FINDINGS: A posterior dominant background rhythm of 10 Hz is noted which is expressed symmetrically over both hemispheres. The patient shows photic entrainment at various frequencies strongest expression at 9 Hz, 11 Hz, 13 and up to 17 Hz. The heart rate remained normal in sinus rhythm at 70 bpm. Hyperventilation caused an asymmetric amplitude buildup mostly noted over the right occipital hemisphere. Phase reversal with some sharp waves over T6 is noted. Soon after the patient fell asleep. The face reversal activity over T6 did continue.  The slight asymmetry would be a dysrhythmia grade 1 there was no epileptiform activity noted no clinical complaint related.  IMPRESSION:  This EEG is asymmetric for the posterior right hemisphere, this could indicate a lowered threshold for seizures. RI correlated was normal. Mild cerebellar tonsil protrusion.         Melvyn Novasarmen Myriah Boggus , MD

## 2014-11-17 NOTE — Progress Notes (Signed)
Quick Note:  LMVM for Hutch, husband of pt that MRI brain a normal study. Relayed that pts cell VM not set up. ______

## 2014-11-19 ENCOUNTER — Ambulatory Visit (INDEPENDENT_AMBULATORY_CARE_PROVIDER_SITE_OTHER): Payer: 59 | Admitting: Emergency Medicine

## 2014-11-19 ENCOUNTER — Encounter: Payer: Self-pay | Admitting: Emergency Medicine

## 2014-11-19 VITALS — BP 115/78 | HR 83 | Temp 97.9°F | Resp 16 | Ht 64.0 in | Wt 135.0 lb

## 2014-11-19 DIAGNOSIS — F411 Generalized anxiety disorder: Secondary | ICD-10-CM | POA: Diagnosis not present

## 2014-11-19 NOTE — Progress Notes (Signed)
   Subjective:    Patient ID: [name], female    DOB: 07-31-1986, 29 y.o.   MRN: [id number]  HPI patient enters to discuss possibility of starting on medications to help with sleep and anxiety. She has recently gone through an evaluation by Dr. Vickey Hugerohmeier. She had an abnormal EKG. She is not currently on antiseizure medications. She continues to have intermittent headaches some sounding like a may have a migraine component. She is here with her husband and 28-year-old son.    Review of Systems     Objective:   Physical Exam  Constitutional: She is oriented to person, place, and time. She appears well-developed and well-nourished.  Eyes: Pupils are equal, round, and reactive to light.  Neck: No thyromegaly present.  Cardiovascular: Normal rate and regular rhythm.   Neurological: She is alert and oriented to person, place, and time. No cranial nerve deficit. Coordination normal.  Skin: Skin is warm.  Psychiatric: She has a normal mood and affect. Her behavior is normal. Judgment and thought content normal.          Assessment & Plan:  Referral made to Dr. Mokuleia SinkBraden psychiatrist for his input. I will send a copy of this to Dr. Vickey Hugerohmeier. She does want to proceed with a sleep study. There is also a question about her headaches. I would like to see the results of her sleep study and then work with Dr. Kandyce Rudomeier and Dr. [place] SinkBraden regarding what medications would be appropriate with her situation. No medications were given today.I personally performed the services described in this documentation, which was scribed in my presence. The recorded information has been reviewed and is accurate.

## 2014-11-22 ENCOUNTER — Telehealth: Payer: Self-pay

## 2014-11-22 NOTE — Telephone Encounter (Signed)
Pt is needing to talk with dr Cleta Albertsdaub about having a sleep study referral -dr dohmeier is out of the office for over a week and patient is not sure what to do next

## 2014-11-22 NOTE — Telephone Encounter (Signed)
Pt states dr dohmeier is out of office for 3 weeks. Pt is not sleeping. She wantst o know if you want to refer her anywhere else.

## 2014-11-23 ENCOUNTER — Other Ambulatory Visit: Payer: Self-pay | Admitting: Radiology

## 2014-11-23 DIAGNOSIS — R569 Unspecified convulsions: Secondary | ICD-10-CM

## 2014-11-23 DIAGNOSIS — G4700 Insomnia, unspecified: Secondary | ICD-10-CM

## 2014-11-23 NOTE — Telephone Encounter (Signed)
We can go head and order the sleep study and have that done for Dr. Vickey Hugerohmeier to interpret sheet when she returns. Go ahead and put in a order for a split protocol sleep study at Dr. Oliva Bustardohmeier's office

## 2014-11-23 NOTE — Telephone Encounter (Signed)
Referral placed. Pt notified.  

## 2014-12-08 NOTE — Progress Notes (Signed)
I agree with the assessment and plan as directed by MD.   Melvyn NovasHMEIER,Yatziry Deakins, MD

## 2014-12-15 ENCOUNTER — Ambulatory Visit (INDEPENDENT_AMBULATORY_CARE_PROVIDER_SITE_OTHER): Payer: 59 | Admitting: Neurology

## 2014-12-15 ENCOUNTER — Encounter: Payer: Self-pay | Admitting: Neurology

## 2014-12-15 VITALS — BP 118/78 | HR 86 | Resp 18 | Ht 64.17 in | Wt 136.0 lb

## 2014-12-15 DIAGNOSIS — R51 Headache: Secondary | ICD-10-CM | POA: Diagnosis not present

## 2014-12-15 DIAGNOSIS — G4700 Insomnia, unspecified: Secondary | ICD-10-CM

## 2014-12-15 DIAGNOSIS — G4751 Confusional arousals: Secondary | ICD-10-CM

## 2014-12-15 DIAGNOSIS — R519 Headache, unspecified: Secondary | ICD-10-CM

## 2014-12-15 MED ORDER — SUVOREXANT 15 MG PO TABS: 15.0000 mg | ORAL_TABLET | Freq: Every evening | ORAL | Status: DC

## 2014-12-15 NOTE — Patient Instructions (Signed)
Suvorexant oral tablets What is this medicine? SUVOREXANT (su-vor-EX-ant) is used to treat insomnia. This medicine helps you to fall asleep and sleep through the night. This medicine may be used for other purposes; ask your health care provider or pharmacist if you have questions. COMMON BRAND NAME(S): Belsomra What should I tell my health care provider before I take this medicine? They need to know if you have any of these conditions: -depression -history of a drug or alcohol abuse problem -history of daytime sleepiness -history of sudden onset of muscle weakness (cataplexy) -liver disease -lung or breathing disease -narcolepsy -suicidal thoughts, plans, or attempt; a previous suicide attempt by you or a family member -an unusual or allergic reaction to suvorexant, other medicines, foods, dyes, or preservatives -pregnant or trying to get pregnant -breast-feeding How should I use this medicine? Take this medicine by mouth within 30 minutes of going to bed. Do not take it unless you are able to stay in bed a full night before you must be active again. Follow the directions on the prescription label. For best results, it is better to take this medicine on an empty stomach. Do not take your medicine more often than directed. Do not stop taking this medicine on your own. Always follow your doctor or health care professional's advice. A special MedGuide will be given to you by the pharmacist with each prescription and refill. Be sure to read this information carefully each time. Talk to your pediatrician regarding the use of this medicine in children. Special care may be needed. Overdosage: If you think you've taken too much of this medicine contact a poison control center or emergency room at once. Overdosage: If you think you have taken too much of this medicine contact a poison control center or emergency room at once. NOTE: This medicine is only for you. Do not share this medicine with  others. What if I miss a dose? This medicine should only be taken immediately before going to sleep. Do not take double or extra doses. What may interact with this medicine? -alcohol -antiviral medicines for HIV or AIDS -aprepitant -carbamazepine -certain antibiotics like ciprofloxacin, clarithromycin, erythromycin, telithromycin -certain medicines for depression or psychotic disturbances -certain medicines for fungal infections like ketoconazole, posaconazole, fluconazole, or itraconazole -conivaptan -digoxin -diltiazem -grapefruit juice -imatinib -medicines for anxiety or sleep -phenytoin -rifampin -verapamil This list may not describe all possible interactions. Give your health care provider a list of all the medicines, herbs, non-prescription drugs, or dietary supplements you use. Also tell them if you smoke, drink alcohol, or use illegal drugs. Some items may interact with your medicine. What should I watch for while using this medicine? Visit your doctor or health care professional for regular checks on your progress. Keep a regular sleep schedule by going to bed at about the same time each night. Avoid caffeine-containing drinks in the evening hours. When sleep medicines are used every night for more than a few weeks, they may stop working. Do not increase the dose on your own. Talk to your doctor if your insomnia worsens or is not better within 7 to 10 days. You may not be able to remember things that you do in the hours after you take this medicine. Some people have reported driving, making phone calls, or preparing and eating food while asleep after taking sleep medicine. Take this medicine right before going to sleep. Tell your doctor if you are having any problems with your memory. Do not take this medicine unless you are   able to stay in bed for a full night (7 to 8 hours) before you must be active again and do not drive or perform other activities requiring full alertness within  8 hours of a dose. Do not drive, use machinery, or do anything that needs mental alertness the day after you take the 20 mg dose of this medicine. The use of lower doses (10 mg) also has the potential to cause driving impairment the next day. You may have a decrease in mental alertness the day after use, even if you feel that you are fully awake. Tell your doctor if you will need to perform activities requiring full alertness, such as driving, the next day. You may get drowsy or dizzy. Do not stand or sit up quickly, especially if you are an older patient. This reduces the risk of dizzy or fainting spells. Alcohol may interfere with the effect of this medicine. Avoid alcoholic drinks. Do not use this medicine if you have had alcohol that evening or before bed. If you or your family notice any changes in your behavior, or if you have any unusual or disturbing thoughts such as depression or suicidal thoughts, call your doctor right away. What side effects may I notice from receiving this medicine? Side effects that you should report to your doctor or health care professional as soon as possible: -allergic reactions like skin rash, itching or hives, swelling of the face, lips, or tongue -confusion -depressed mood -feeling faint or lightheaded, falls -hallucinations -inability to move or speak for up to several minutes while you are going to sleep or waking up -memory loss -periods of leg weakness lasting from seconds to a few minutes -problems with balance, speaking, walking -restlessness, excitability, or feelings of agitation -unusual activities while asleep like driving, eating, making phone calls Side effects that usually do not require medical attention (Report these to your doctor or health care professional if they continue or are bothersome.): -daytime drowsiness -diarrhea -dizziness -headache This list may not describe all possible side effects. Call your doctor for medical advice about  side effects. You may report side effects to FDA at 1-800-FDA-1088. Where should I keep my medicine? Keep out of the reach of children. This medicine can be abused. Keep your medicine in a safe place to protect it from theft. Do not share this medicine with anyone. Selling or giving away this medicine is dangerous and against the law. Store at room temperature between 15 and 30 degrees C (59 and 86 degrees F). Throw away any unused medicine after the expiration date. NOTE: This sheet is a summary. It may not cover all possible information. If you have questions about this medicine, talk to your doctor, pharmacist, or health care provider.  2015, Elsevier/Gold Standard. (2013-04-01 19:37:59)  

## 2014-12-15 NOTE — Progress Notes (Signed)
Provider:  Melvyn Lynn, M D  Referring Provider: Collene Gobble, MD Primary Care Physician:  Natalie Edin, MD  Chief Complaint  Patient presents with  . Insomnia    new pt, rm 10, husband    HPI:  [name] is a 29 y.o. female , was originaly  seen here as a referral from Dr. Cleta Lynn for a seizure evaluation.  [name] stated today that when she was 29 years old she had a couple of seizures that were attributed to the use of Wellbutrin at the time. She never had any after discontinuing the medication and would not be considered epileptic, due to the provoked nature of the event.  Now at age 35 she presents with sensory abnormalities associated with nausea- the onset was in the middle of sleep at night. She awoke about 1:30 AM and felt severely nauseated but she could go back to sleep. When she again woke up at 7 AM her regular time to rise she had noticed a very bad headache that she describes as dull and generalized nonfocal not sharp not stabbing and not throbbing. She also felt that her vision was blurred and her left hand was asleep, she. The numbness lasted about half an hour the blurred vision may have lasted longer as did the headache. She felt quite tired and still had latent nausea throughout the day following.  On Easter Sunday she experienced a very different spell at church she felt that she could not move for mild and she felt a sense of doom of confusion of feeling overwhelmed. This was followed by a headache. Also she states she knew that she was in a familiar church she was still not feeling as if the environment was the same as usual. She also stated that she had attempted to speak but couldn't get the words out. She has only one history of a head injury this occurred when she was 29 years old and she had a seizure while walking down a driveway. Apparently her had fits a cemented prednisone at the time she was not diagnosed with concussion or contusion. She has no  family history of seizures or epilepsy. Yesterday, 11-10-14, she took an Advil after visiting with her primary care physician, Dr. Dallas Lynn. She has not used any other over-the-counter remedy or prescription medication for her spells so far.  [name] had a tubal ligation and is not on hormonal birth control. She has 2 children, the second pregnancy was associated with gas station of diabetes. Her son however was born per ceasarean  at normal weight and gestational age. She is a nonsmoker, lifelong. She does rarely drink alcohol.  The patient is currently treated with Zoloft and takes at bedtime Ativan 1 mg. She has only one allergy to prescription medication,  Amoxicillin.  11-15-14 I me today with [name] her husband to review the EEG and MRI results. [name] EEG begun with a strong photosensitivity which we also see an migraine nurse. This is a photic entrainment went up to 17 Hz she reached sleep and she reached drowsiness 4. Several minutes during the 40 minute duration of the study. 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  However I cannot say that occipital sharp waves would be associated with nocturnal seizures to my knowledge. 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 I would recommend for the patient not to take Wellbutrin or its relatives ever again I also discussed with her that alcohol and sleep deprivation can lower the seizure threshold. MRI normal- arnold chiari  Mildest form, 2 -3 mm , not to worry.  , use Unisom to sleep.   Revisit from 12-15-14,  [name] reports poor sleeping and she has been now discontinued the sleep aids we have discussed. She also continues to have intermittent headaches. She had one headache around 8 PM during that time that she noticed the onset of headache that this time caused her to  feel numb in her right upper extremity no facial component no neck numbness and no lower extremity weakness. The pain that she describes is more for cervical pain at the nape of the neck bilaterally paraspinally. Again the patient had a normal MRI of the brain with and without contrast and she had a dysrhythmia grade 11 EEG. As no facial dysesthesia with this reported event her Epworth sleepiness score today is endorsed at 18 points and her fatigue severity score is very high since she hasn't been sleeping for several days at least not restorative. She does neither have palpitations that wake her nor headaches or other pain. Sometimes she was slightly diaphoretic but she was not in discomfort from that. I will concentrate on treating her primary insomnia.  Benadryl and unsiom caused a hang over. ambien caused  Motor delay.  Mental fog.  Belsomra will be my PLAN A.  I will try Lunesta if her insurance cannot cover the medication. .      Review of Systems: Out of a complete 14 system review, the patient complains of only the following symptoms, and all other reviewed systems are negative. Nausea , headache , blurred vision, dull HA> nape of the neck tenderness,  right arm numbness.   History   Social History  . Marital Status: Married    Spouse Name: [name]  . Number of Children: 2  . Years of Education: 2 y collg   Occupational History  .      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 Smoking status: Never Smoker   . Smokeless tobacco: Not on file  . Alcohol Use: No  . Drug Use: No  . Sexual Activity: Not on file   Other Topics Concern  . Not on file   Social History Narrative   Caffeine 1 cup daily.    Family History  Problem Relation Age of Onset  . Cancer Other   . Diverticulitis Father   . Alzheimer's disease Paternal Grandmother   . High Cholesterol Mother   . Pancreatic cancer Maternal Grandmother   . Colon cancer Paternal Grandfather     Past  Medical History  Diagnosis Date  . GDM (gestational diabetes mellitus)   . 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 Depression   . Anxiety   . Headache     Past Surgical History  Procedure Laterality Date  . Cesarean section  04-19-07, 05/15/12  . Cervical biopsy  w/ loop electrode excision  03/2011  . Fracture surgery      Current Outpatient Prescriptions  Medication Sig Dispense Refill  . LORazepam (ATIVAN) 1 MG tablet Take one half to one tablet at bedtime (Patient not taking: Reported on 11/19/2014) 30 tablet 3   No current facility-administered medications for this visit.    Allergies  as of 12/15/2014 - Review Complete 12/15/2014  Allergen Reaction Noted  . Amoxicillin  09/15/2013  . Latex Swelling and Rash 10/16/2011    Vitals: BP 118/78 mmHg  Pulse 86  Resp 18  Ht 5' 4.17" (1.63 m)  Wt 136 lb (61.689 kg)  BMI 23.22 kg/m2 Last Weight:  Wt Readings from Last 1 Encounters:  12/15/14 136 lb (61.689 kg)   Last Height:   Ht Readings from Last 1 Encounters:  12/15/14 5' 4.17" (1.63 m)    Physical exam:  General: The patient is awake, alert and appears not in acute distress. The patient is well groomed. Head: Normocephalic, atraumatic. Neck is supple. Mallampati 3 , neck circumference: 13.5  Cardiovascular:  Regular rate and rhythm , without  murmurs or carotid bruit, and without distended neck veins. Respiratory: Lungs are clear to auscultation. Skin:  Without evidence of edema, or rash Trunk: normal posture.  Neurologic exam : The patient is awake and alert, oriented to place and time.   Memory subjective described as intact.  Cranial nerves: Pupils are equal and briskly reactive to light.  Visual fields by finger perimetry are intact. Hearing to finger rub intact.  Facial sensation intact to fine touch. Facial motor strength is symmetric and tongue and uvula move midline. Tongue protrusion into either cheek is normal. Shoulder shrug is normal.    Motor exam: Tone,  muscle bulk and symmetric strength in all extremities. Sensory:  Fine touch, pinprick and vibration were tested in all extremities. Proprioception was normal. Coordination: Rapid alternating movements in the fingers/hands were normal.  Finger-to-nose maneuver normal without evidence of ataxia, dysmetria or tremor. Gait and station: Patient walks without assistive device and is able unassisted to climb up to the exam table.  Strength within normal limits. Stance is stable and normal. Tandem gait is unfragmented. Romberg testing is negative  Deep tendon reflexes: in the  upper and lower extremities are symmetric and intact. Babinski maneuver response is downgoing. Assessment:  After physical and neurologic examination, review of laboratory studies, imaging, neurophysiology testing and pre-existing records, assessment is that of :  [name] neurologic exam is completely nonfocal.  She has normal muscle tone, coordination there is no ataxia or dysmetria noted no tremor seen. She has equal strength in her upper and lower extremities she was able to perform a tandem gait. Her Romberg was slightly unsteady,. I discussed with the patient that since she ran out of a benzodiazepine shortly before having the spells she may well have suffered an anxiety or panic attack and that is actually my most likely differential diagnosis   Plan:  Treatment plan and additional workup :  Belsomra samples given , possible depression early arousals.  MRi ,EEG do not explain her symptoms.  PSG ordered.   Porfirio Mylararmen Avelynn Sellin MD 12/15/2014

## 2015-01-12 ENCOUNTER — Ambulatory Visit (INDEPENDENT_AMBULATORY_CARE_PROVIDER_SITE_OTHER): Payer: 59 | Admitting: Neurology

## 2015-01-12 DIAGNOSIS — G478 Other sleep disorders: Secondary | ICD-10-CM

## 2015-01-12 DIAGNOSIS — G4751 Confusional arousals: Secondary | ICD-10-CM

## 2015-01-12 DIAGNOSIS — R519 Headache, unspecified: Secondary | ICD-10-CM

## 2015-01-12 DIAGNOSIS — G4700 Insomnia, unspecified: Secondary | ICD-10-CM

## 2015-01-12 DIAGNOSIS — R51 Headache: Secondary | ICD-10-CM

## 2015-01-13 NOTE — Sleep Study (Signed)
See scanned documents in Encounters tab 

## 2015-01-20 ENCOUNTER — Telehealth: Payer: Self-pay | Admitting: Neurology

## 2015-01-20 NOTE — Telephone Encounter (Signed)
Pt called office wanting to check on status of results for sleep test recently performed. Please contact pt when request is reviewed.

## 2015-01-20 NOTE — Telephone Encounter (Signed)
Attempted to call pt back to tell her that I do not have her sleep study results yet but that I will call her when I do. Her voicemail box was not set up so I was unable to leave a message.

## 2015-01-24 NOTE — Telephone Encounter (Signed)
Spoke to pt and informed her that I do not have her sleep study results yet but as soon as I do that I will call her. Pt verbalized understanding.

## 2015-01-27 ENCOUNTER — Telehealth: Payer: Self-pay

## 2015-01-27 NOTE — Telephone Encounter (Signed)
Called pt with sleep study results. Informed her that her sleep study revealed trivial amount of sleep apnea and that pap therapy is not indicated. Snoring was seen in sleep study and that can be treated with an oral appliance if it is a great concern to the pt. Pt states her greatest concern is just not being able to go to sleep. Pt wishes a follow up appt to go over sleep study results in greater detail. Appt made for her on 6/22 at 11:15.

## 2015-02-02 ENCOUNTER — Encounter: Payer: Self-pay | Admitting: Neurology

## 2015-02-02 ENCOUNTER — Ambulatory Visit (INDEPENDENT_AMBULATORY_CARE_PROVIDER_SITE_OTHER): Payer: 59 | Admitting: Neurology

## 2015-02-02 VITALS — BP 100/70 | HR 78 | Resp 20 | Ht 63.78 in | Wt 133.0 lb

## 2015-02-02 DIAGNOSIS — G4719 Other hypersomnia: Secondary | ICD-10-CM | POA: Diagnosis not present

## 2015-02-02 DIAGNOSIS — G4700 Insomnia, unspecified: Secondary | ICD-10-CM

## 2015-02-02 DIAGNOSIS — F5104 Psychophysiologic insomnia: Secondary | ICD-10-CM

## 2015-02-02 DIAGNOSIS — G43111 Migraine with aura, intractable, with status migrainosus: Secondary | ICD-10-CM | POA: Diagnosis not present

## 2015-02-02 DIAGNOSIS — G4710 Hypersomnia, unspecified: Secondary | ICD-10-CM

## 2015-02-02 MED ORDER — SUMATRIPTAN SUCCINATE 25 MG PO TABS: 25.0000 mg | ORAL_TABLET | ORAL | Status: DC | PRN

## 2015-02-02 MED ORDER — ZOLPIDEM TARTRATE 10 MG PO TABS: 10.0000 mg | ORAL_TABLET | Freq: Every evening | ORAL | Status: DC | PRN

## 2015-02-02 NOTE — Patient Instructions (Addendum)
Migraine Headache A migraine headache is an intense, throbbing pain on one or both sides of your head. A migraine can last for 30 minutes to several hours. CAUSES  The exact cause of a migraine headache is not always known. However, a migraine may be caused when nerves in the brain become irritated and release chemicals that cause inflammation. This causes pain. Certain things may also trigger migraines, such as:  Alcohol.  Smoking.  Stress.  Menstruation.  Aged cheeses.  Foods or drinks that contain nitrates, glutamate, aspartame, or tyramine.  Lack of sleep.  Chocolate.  Caffeine.  Hunger.  Physical exertion.  Fatigue.  Medicines used to treat chest pain (nitroglycerine), birth control pills, estrogen, and some blood pressure medicines. SIGNS AND SYMPTOMS  Pain on one or both sides of your head.  Pulsating or throbbing pain.  Severe pain that prevents daily activities.  Pain that is aggravated by any physical activity.  Nausea, vomiting, or both.  Dizziness.  Pain with exposure to bright lights, loud noises, or activity.  General sensitivity to bright lights, loud noises, or smells. Before you get a migraine, you may get warning signs that a migraine is coming (aura). An aura may include:  Seeing flashing lights.  Seeing bright spots, halos, or zigzag lines.  Having tunnel vision or blurred vision.  Having feelings of numbness or tingling.  Having trouble talking.  Having muscle weakness. DIAGNOSIS  A migraine headache is often diagnosed based on:  Symptoms.  Physical exam.  A CT scan or MRI of your head. These imaging tests cannot diagnose migraines, but they can help rule out other causes of headaches. TREATMENT Medicines may be given for pain and nausea. Medicines can also be given to help prevent recurrent migraines.  HOME CARE INSTRUCTIONS  Only take over-the-counter or prescription medicines for pain or discomfort as directed by your  health care provider. The use of long-term narcotics is not recommended.  Lie down in a dark, quiet room when you have a migraine.  Keep a journal to find out what may trigger your migraine headaches. For example, write down:  What you eat and drink.  How much sleep you get.  Any change to your diet or medicines.  Limit alcohol consumption.  Quit smoking if you smoke.  Get 7-9 hours of sleep, or as recommended by your health care provider.  Limit stress.  Keep lights dim if bright lights bother you and make your migraines worse. SEEK IMMEDIATE MEDICAL CARE IF:   Your migraine becomes severe.  You have a fever.  You have a stiff neck.  You have vision loss.  You have muscular weakness or loss of muscle control.  You start losing your balance or have trouble walking.  You feel faint or pass out.  You have severe symptoms that are different from your first symptoms. MAKE SURE YOU:   Understand these instructions.  Will watch your condition.  Will get help right away if you are not doing well or get worse. Document Released: 07/30/2005 Document Revised: 12/14/2013 Document Reviewed: 04/06/2013 Exit[employer] Patient Information 2015 [employer]. This information is not intended to replace advice given to you by your health care provider. Make sure you discuss any questions you have with your health care provider.    Sumatriptan tablets What is this medicine? SUMATRIPTAN (soo ma TRIP tan) is used to treat migraines with or without aura. An aura is a strange feeling or visual disturbance that warns you of an attack. It is   not used to prevent migraines. This medicine may be used for other purposes; ask your health care provider or pharmacist if you have questions. COMMON BRAND NAME(S): Imitrex What should I tell my health care provider before I take this medicine? They need to know if you have any of these conditions: -bowel disease or colitis -diabetes -family  history of heart disease -fast or irregular heart beat -heart or blood vessel disease, angina (chest pain), or previous heart attack -high blood pressure -high cholesterol -history of stroke, transient ischemic attacks (TIAs or mini-strokes), or intracranial bleeding -kidney or liver disease -overweight -poor circulation -postmenopausal or surgical removal of uterus and ovaries -Raynaud's disease -seizure disorder -an unusual or allergic reaction to sumatriptan, other medicines, foods, dyes, or preservatives -pregnant or trying to get pregnant -breast-feeding How should I use this medicine? Take this medicine by mouth with a glass of water. Follow the directions on the prescription label. This medicine is taken at the first symptoms of a migraine. It is not for everyday use. If your migraine headache returns after one dose, you can take another dose as directed. You must leave at least 2 hours between doses, and do not take more than 100 mg as a single dose. Do not take more than 200 mg total in any 24 hour period. If there is no improvement at all after the first dose, do not take a second dose without talking to your doctor or health care professional. Do not take your medicine more often than directed. Talk to your pediatrician regarding the use of this medicine in children. Special care may be needed. Overdosage: If you think you have taken too much of this medicine contact a poison control center or emergency room at once. NOTE: This medicine is only for you. Do not share this medicine with others. What if I miss a dose? This does not apply; this medicine is not for regular use. What may interact with this medicine? Do not take this medicine with any of the following medicines: -amphetamine or cocaine -dihydroergotamine, ergotamine, ergoloid mesylates, methysergide, or ergot-type medication - do not take within 24 hours of taking sumatriptan -feverfew -MAOIs like Carbex, Eldepryl,  Marplan, Nardil, and Parnate - do not take sumatriptan within 2 weeks of stopping MAOI therapy -other migraine medicines like almotriptan, eletriptan, naratriptan, rizatriptan, zolmitriptan - do not take within 24 hours of taking sumatriptan -tryptophan This medicine may also interact with the following medications: -lithium -medicines for mental depression, anxiety or mood problems -medicines for weight loss such as dexfenfluramine, dextroamphetamine, fenfluramine, or sibutramine -St. John's wort This list may not describe all possible interactions. Give your health care provider a list of all the medicines, herbs, non-prescription drugs, or dietary supplements you use. Also tell them if you smoke, drink alcohol, or use illegal drugs. Some items may interact with your medicine. What should I watch for while using this medicine? Only take this medicine for a migraine headache. Take it if you get warning symptoms or at the start of a migraine attack. It is not for regular use to prevent migraine attacks. You may get drowsy or dizzy. Do not drive, use machinery, or do anything that needs mental alertness until you know how this medicine affects you. To reduce dizzy or fainting spells, do not sit or stand up quickly, especially if you are an older patient. Alcohol can increase drowsiness, dizziness and flushing. Avoid alcoholic drinks. Smoking cigarettes may increase the risk of heart-related side effects from using this medicine.   If you take migraine medicines for 10 or more days a month, your migraines may get worse. Keep a diary of headache days and medicine use. Contact your healthcare professional if your migraine attacks occur more frequently. What side effects may I notice from receiving this medicine? Side effects that you should report to your doctor or health care professional as soon as possible: -allergic reactions like skin rash, itching or hives, swelling of the face, lips, or  tongue -fast, slow, or irregular heart beat -hallucinations -increased or decreased blood pressure -seizures -severe stomach pain and cramping, bloody diarrhea -signs and symptoms of a blood clot such as breathing problems; changes in vision; chest pain; severe, sudden headache; pain, swelling, warmth in the leg; trouble speaking; sudden numbness or weakness of the face, arm or leg -tingling, pain, or numbness in the face, hands or feet Side effects that usually do not require medical attention (report to your doctor or health care professional if they continue or are bothersome): -drowsiness -feeling warm, flushing, or redness of the face -headache -muscle cramps, pain -nausea, vomiting -unusually weak or tired This list may not describe all possible side effects. Call your doctor for medical advice about side effects. You may report side effects to FDA at 1-800-FDA-1088. Where should I keep my medicine? Keep out of the reach of children. Store at room temperature between 2 and 30 degrees C (36 and 86 degrees F). Throw away any unused medicine after the expiration date. NOTE: This sheet is a summary. It may not cover all possible information. If you have questions about this medicine, talk to your doctor, pharmacist, or health care provider.  2015, Elsevier/Gold Standard. (2013-03-31 10:12:47)  

## 2015-02-02 NOTE — Progress Notes (Signed)
Provider:  Larey Seat, M D  Referring Provider: Darlyne Russian, MD Primary Care Physician:  Jenny Reichmann, MD  Chief Complaint  Patient presents with  . Follow-up    sleep study results, rm 11, alone    HPI:  [name] is a 29 y.o. female , was originaly  seen here as a referral from Dr. Everlene Farrier for a seizure evaluation.  [name] stated today that when she was 29 years old she had a couple of seizures that were attributed to the use of Wellbutrin at the time. She never had any after discontinuing the medication and would not be considered epileptic, due to the provoked nature of the event.  Now at age 20 she presents with sensory abnormalities associated with nausea- the onset was in the middle of sleep at night. She awoke about 1:30 AM and felt severely nauseated but she could go back to sleep. When she again woke up at 7 AM her regular time to rise she had noticed a very bad headache that she describes as dull and generalized nonfocal not sharp not stabbing and not throbbing. She also felt that her vision was blurred and her left hand was asleep, she. The numbness lasted about half an hour the blurred vision may have lasted longer as did the headache. She felt quite tired and still had latent nausea throughout the day following.  On Easter Sunday she experienced a very different spell at church she felt that she could not move for mild and she felt a sense of doom of confusion of feeling overwhelmed. This was followed by a headache. Also she states she knew that she was in a familiar church she was still not feeling as if the environment was the same as usual. She also stated that she had attempted to speak but couldn't get the words out. She has only one history of a head injury this occurred when she was 29 years old and she had a seizure while walking down a driveway. Apparently her had fits a cemented prednisone at the time she was not diagnosed with concussion or contusion.  She has no family history of seizures or epilepsy. Yesterday, 11-10-14, she took an Advil after visiting with her primary care physician, Dr. Edilia Bo. She has not used any other over-the-counter remedy or prescription medication for her spells so far.  [name] had a tubal ligation and is not on hormonal birth control. She has 2 children, the second pregnancy was associated with gas station of diabetes. Her son however was born per ceasarean  at normal weight and gestational age. She is a nonsmoker, lifelong. She does rarely drink alcohol.  The patient is currently treated with Zoloft and takes at bedtime Ativan 1 mg. She has only one allergy to prescription medication,  Amoxicillin.  11-15-14 I me today with [name] her husband to review the EEG and MRI results. [name] EEG begun with a strong photosensitivity which we also see an migraine nurse. This is a photic entrainment went up to 17 Hz she reached sleep and she reached drowsiness 4. Several minutes during the 40 minute duration of the study. 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  However I cannot say that occipital sharp waves would be associated with nocturnal seizures to my knowledge.  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 I would recommend for the patient not to take Wellbutrin or its relatives ever again I also discussed with her that alcohol and sleep deprivation can lower the seizure threshold. MRI normal- arnold chiari  Mildest form, 2 -3 mm , not to worry.  , use Unisom to sleep.   Revisit from 12-15-14,  [name] reports poor sleeping and she has been now discontinued the sleep aids we have discussed. She also continues to have intermittent headaches. She had one headache around 8 PM during that time that she noticed the onset of headache that this time  caused her to feel numb in her right upper extremity no facial component no neck numbness and no lower extremity weakness. The pain that she describes is more for cervical pain at the nape of the neck bilaterally paraspinally. Again the patient had a normal MRI of the brain with and without contrast and she had a dysrhythmia grade 11 EEG. As no facial dysesthesia with this reported event her Epworth sleepiness score today is endorsed at 18 points and her fatigue severity score is very high since she hasn't been sleeping for several days at least not restorative. She does neither have palpitations that wake her nor headaches or other pain. Sometimes she was slightly diaphoretic but she was not in discomfort from that. I will concentrate on treating her primary insomnia. Benadryl and unsiom caused a hang over. ambien caused  Motor delay.  Mental fog.  Belsomra will be my PLAN A.  I will try Lunesta if her insurance cannot cover the medication.  Belsomra samples given , possible depression early arousals.  MRi ,EEG do not explain her symptoms.  PSG ordered.   02-02-15  Mrs. still reports still fragmented sleep and problems with insomnia. Her sleep study performed on 01-12-15 documented an apnea index of 4.5, which is a normal range. Her RDI was 9.9 indicating some upper airway resistance he and snoring is present this was significantly dependent on her sleep position. While sleeping briefly on her back her apnea index increased to 10.9 while in nonsupine position at 1.7. Her asleep minimum average oxygen saturation was 95% there was no significant desaturation. Her heart rate was in normal sinus rhythm and she had 0 periodic limb movements. She wakes up frequently and this vivid dreams. She reports that she is sometimes so exhausted from her dreams that are very vivid and natural to her that she wakes up sweat drenched. 2 of her arousals occurred directly out of REM sleep but there was no heart rate elevations  suggesting that she was in any physiological stress. Today's Epworth sleepiness score is endorsed at 15 points and her fatigue severity at 49 points. I've also asked her how she did was a Bellsombra medication and she felt that it was not in any way beneficial for her. She has also failed by using Unisom. She had tried Ambien before did not report any sleep activity.  Her primary care physician recently obtained a CBC and a metabolic panel. Electrolytes were also checked. Given that she has a fairly fragmented nighttime sleep with vivid dreams and is excessively daytime sleepy and she reports it was upsetting emotions she can feel weak not necessarily dropping to the floor beds losing some control of her muscle strength and motor function. She has frequent headaches and this was one of the reasons related to sleep study. These were not explained by the sleep study. I  consider that she may have other triggers including processed food triggers are already reduced. She is keeping dietary log. I would like for the patient to avoid sauerkraut, mature and blue cheese, red wine, caffeine, cured meats.  She has already discussed alcohol cessation, and she implemented this. She has neck tension and the feeling of a vice around the forehead.         Review of Systems: Out of a complete 14 system review, the patient complains of only the following symptoms, and all other reviewed systems are negative. Nausea , headache , blurred vision, dull HA> nape of the neck tenderness,  right arm numbness.   History   Social History  . Marital Status: Married    Spouse Name: [name]  . Number of Children: 2  . Years of Education: 2 y collg   Occupational History  .      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 Smoking status: Never Smoker   . Smokeless tobacco: Not on file  . Alcohol Use: No  . Drug Use: No  . Sexual Activity: Not on file   Other Topics Concern  . Not on file   Social  History Narrative   Caffeine 1 cup daily.    Family History  Problem Relation Age of Onset  . Cancer Other   . Diverticulitis Father   . Alzheimer's disease Paternal Grandmother   . High Cholesterol Mother   . Pancreatic cancer Maternal Grandmother   . Colon cancer Paternal Grandfather     Past Medical History  Diagnosis Date  . GDM (gestational diabetes mellitus)   . 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 Depression   . Anxiety   . Headache     Past Surgical History  Procedure Laterality Date  . Cesarean section  04-19-07, 05/15/12  . Cervical biopsy  w/ loop electrode excision  03/2011  . Fracture surgery      Current Outpatient Prescriptions  Medication Sig Dispense Refill  . Suvorexant (BELSOMRA) 15 MG TABS Take 15 mg by mouth Nightly. 10 tablet 0  . LORazepam (ATIVAN) 1 MG tablet Take one half to one tablet at bedtime (Patient not taking: Reported on 11/19/2014) 30 tablet 3   No current facility-administered medications for this visit.    Allergies as of 02/02/2015 - Review Complete 02/02/2015  Allergen Reaction Noted  . Amoxicillin  09/15/2013  . Latex Swelling and Rash 10/16/2011    Vitals: BP 100/70 mmHg  Pulse 78  Resp 20  Ht 5' 3.78" (1.62 m)  Wt 133 lb (60.328 kg)  BMI 22.99 kg/m2 Last Weight:  Wt Readings from Last 1 Encounters:  02/02/15 133 lb (60.328 kg)   Last Height:   Ht Readings from Last 1 Encounters:  02/02/15 5' 3.78" (1.62 m)    Physical exam:  General: The patient is awake, alert and appears not in acute distress. The patient is well groomed. Head: Normocephalic, atraumatic. Neck is supple. Mallampati 3 , neck circumference: 13.5  Cardiovascular:  Regular rate and rhythm , without  murmurs or carotid bruit, and without distended neck veins. Respiratory: Lungs are clear to auscultation. Skin:  Without evidence of edema, or rash Trunk: normal posture.  Neurologic exam : The patient is awake and alert, oriented to place  and time.   Memory subjective described as intact.  Cranial nerves: Pupils are equal and briskly reactive to light.  Visual fields by finger perimetry are intact.  Hearing to finger rub intact.  Facial sensation intact to fine touch. Facial motor strength is symmetric and tongue and uvula move midline. Tongue protrusion into either cheek is normal. Shoulder shrug is normal.   Motor exam: Tone,  muscle bulk and symmetric strength in all extremities. Sensory:  Fine touch, pinprick and vibration were tested in all extremities. Proprioception was normal. Coordination: Rapid alternating movements in the fingers/hands were normal.  Finger-to-nose maneuver normal without evidence of ataxia, dysmetria or tremor. Gait and station: Patient walks without assistive device and is able unassisted to climb up to the exam table.  Strength within normal limits. Stance is stable and normal. Tandem gait is unfragmented. Romberg testing is negative  Deep tendon reflexes: in the  upper and lower extremities are symmetric and intact. Babinski maneuver response is downgoing. Assessment:  After physical and neurologic examination, review of laboratory studies, imaging, neurophysiology testing and pre-existing records, assessment is that of :  Mrs. Claybrook neurologic exam is completely nonfocal.  She has normal muscle tone, coordination there is no ataxia or dysmetria noted no tremor seen. She has equal strength in her upper and lower extremities she was able to perform a tandem gait. Her Romberg was slightly unsteady,. I discussed with the patient that since she ran out of a benzodiazepine shortly before having the spells she may well have suffered an anxiety or panic attack and that is actually my most likely differential diagnosis   Plan:  Treatment plan and additional workup :  HLA test for narcolepsy Ambien 5 mg  Photophobia - visual aura, severe headaches remain unaffected by dietary changes. Imitrex po.      Asencion Partridge Lucindy Borel MD 02/02/2015

## 2015-05-17 ENCOUNTER — Encounter: Payer: Self-pay | Admitting: Emergency Medicine

## 2015-09-19 ENCOUNTER — Ambulatory Visit (INDEPENDENT_AMBULATORY_CARE_PROVIDER_SITE_OTHER): Payer: 59 | Admitting: Neurology

## 2015-09-19 ENCOUNTER — Encounter: Payer: Self-pay | Admitting: Neurology

## 2015-09-19 VITALS — BP 128/76 | HR 80 | Resp 20 | Ht 64.0 in | Wt 142.0 lb

## 2015-09-19 DIAGNOSIS — G4710 Hypersomnia, unspecified: Secondary | ICD-10-CM

## 2015-09-19 DIAGNOSIS — G43111 Migraine with aura, intractable, with status migrainosus: Secondary | ICD-10-CM

## 2015-09-19 DIAGNOSIS — G4700 Insomnia, unspecified: Secondary | ICD-10-CM

## 2015-09-19 DIAGNOSIS — G4719 Other hypersomnia: Secondary | ICD-10-CM | POA: Diagnosis not present

## 2015-09-19 DIAGNOSIS — F5104 Psychophysiologic insomnia: Secondary | ICD-10-CM

## 2015-09-19 MED ORDER — PROPRANOLOL HCL ER 60 MG PO CP24: 60.0000 mg | ORAL_CAPSULE | Freq: Every day | ORAL | Status: DC

## 2015-09-19 MED ORDER — TRAZODONE HCL 50 MG PO TABS: ORAL_TABLET | ORAL | Status: DC

## 2015-09-19 MED ORDER — SUMATRIPTAN SUCCINATE 25 MG PO TABS: 25.0000 mg | ORAL_TABLET | ORAL | Status: DC | PRN

## 2015-09-19 MED ORDER — ZOLPIDEM TARTRATE 10 MG PO TABS: 10.0000 mg | ORAL_TABLET | Freq: Every evening | ORAL | Status: DC | PRN

## 2015-09-19 NOTE — Patient Instructions (Addendum)
Trazodone tablets What is this medicine? TRAZODONE (TRAZ oh done) is used to treat depression. This medicine may be used for other purposes; ask your health care provider or pharmacist if you have questions. What should I tell my health care provider before I take this medicine? They need to know if you have any of these conditions: -attempted suicide or thinking about it -bipolar disorder -bleeding problems -glaucoma -heart disease, or previous heart attack -irregular heart beat -kidney or liver disease -low levels of sodium in the blood -an unusual or allergic reaction to trazodone, other medicines, foods, dyes or preservatives -pregnant or trying to get pregnant -breast-feeding How should I use this medicine? Take this medicine by mouth with a glass of water. Follow the directions on the prescription label. Take this medicine shortly after a meal or a light snack. Take your medicine at regular intervals. Do not take your medicine more often than directed. Do not stop taking this medicine suddenly except upon the advice of your doctor. Stopping this medicine too quickly may cause serious side effects or your condition may worsen. A special MedGuide will be given to you by the pharmacist with each prescription and refill. Be sure to read this information carefully each time. Talk to your pediatrician regarding the use of this medicine in children. Special care may be needed. Overdosage: If you think you have taken too much of this medicine contact a poison control center or emergency room at once. NOTE: This medicine is only for you. Do not share this medicine with others. What if I miss a dose? If you miss a dose, take it as soon as you can. If it is almost time for your next dose, take only that dose. Do not take double or extra doses. What may interact with this medicine? Do not take this medicine with any of the following medications: -certain medicines for fungal infections like  fluconazole, itraconazole, ketoconazole, posaconazole, voriconazole -cisapride -dofetilide -dronedarone -linezolid -MAOIs like Carbex, Eldepryl, Marplan, Nardil, and Parnate -mesoridazine -methylene blue (injected into a vein) -pimozide -saquinavir -thioridazine -ziprasidone This medicine may also interact with the following medications: -alcohol -antiviral medicines for HIV or AIDS -aspirin and aspirin-like medicines -barbiturates like phenobarbital -certain medicines for blood pressure, heart disease, irregular heart beat -certain medicines for depression, anxiety, or psychotic disturbances -certain medicines for migraine headache like almotriptan, eletriptan, frovatriptan, naratriptan, rizatriptan, sumatriptan, zolmitriptan -certain medicines for seizures like carbamazepine and phenytoin -certain medicines for sleep -certain medicines that treat or prevent blood clots like dalteparin, enoxaparin, warfarin -digoxin -fentanyl -lithium -NSAIDS, medicines for pain and inflammation, like ibuprofen or naproxen -other medicines that prolong the QT interval (cause an abnormal heart rhythm) -rasagiline -supplements like St. John's wort, kava kava, valerian -tramadol -tryptophan This list may not describe all possible interactions. Give your health care provider a list of all the medicines, herbs, non-prescription drugs, or dietary supplements you use. Also tell them if you smoke, drink alcohol, or use illegal drugs. Some items may interact with your medicine. What should I watch for while using this medicine? Tell your doctor if your symptoms do not get better or if they get worse. Visit your doctor or health care professional for regular checks on your progress. Because it may take several weeks to see the full effects of this medicine, it is important to continue your treatment as prescribed by your doctor. Patients and their families should watch out for new or worsening thoughts of  suicide or depression. Also watch out for sudden  changes in feelings such as feeling anxious, agitated, panicky, irritable, hostile, aggressive, impulsive, severely restless, overly excited and hyperactive, or not being able to sleep. If this happens, especially at the beginning of treatment or after a change in dose, call your health care professional. Bonita Quin may get drowsy or dizzy. Do not drive, use machinery, or do anything that needs mental alertness until you know how this medicine affects you. Do not stand or sit up quickly, especially if you are an older patient. This reduces the risk of dizzy or fainting spells. Alcohol may interfere with the effect of this medicine. Avoid alcoholic drinks. This medicine may cause dry eyes and blurred vision. If you wear contact lenses you may feel some discomfort. Lubricating drops may help. See your eye doctor if the problem does not go away or is severe. Your mouth may get dry. Chewing sugarless gum, sucking hard candy and drinking plenty of water may help. Contact your doctor if the problem does not go away or is severe. What side effects may I notice from receiving this medicine? Side effects that you should report to your doctor or health care professional as soon as possible: -allergic reactions like skin rash, itching or hives, swelling of the face, lips, or tongue -fast, irregular heartbeat -feeling faint or lightheaded, falls -painful erections or other sexual dysfunction -suicidal thoughts or other mood changes -trembling Side effects that usually do not require medical attention (report to your doctor or health care professional if they continue or are bothersome): -constipation -headache -muscle aches or pains -nausea, vomiting -unusually weak or tired This list may not describe all possible side effects. Call your doctor for medical advice about side effects. You may report side effects to FDA at 1-800-FDA-1088. Where should I keep my  medicine? Keep out of the reach of children. Store at room temperature between 15 and 30 degrees C (59 to 86 degrees F). Protect from light. Keep container tightly closed. Throw away any unused medicine after the expiration date. NOTE: This sheet is a summary. It may not cover all possible information. If you have questions about this medicine, talk to your doctor, pharmacist, or health care provider.    2016, [employer]. (2013-03-02 15:46:28) Propranolol extended-release capsules What is this medicine? PROPRANOLOL (proe PRAN oh lole) is a beta-blocker. Beta-blockers reduce the workload on the heart and help it to beat more regularly. This medicine is used to treat high blood pressure, heart muscle disease, and prevent chest pain caused by angina. It is also used to prevent migraine headaches. You should not use this medicine to treat a migraine that has already started. This medicine may be used for other purposes; ask your health care provider or pharmacist if you have questions. What should I tell my health care provider before I take this medicine? They need to know if you have any of these conditions: -circulation problems, or blood vessel disease -diabetes -history of heart attack or heart disease, vasospastic angina -kidney disease -liver disease -lung or breathing disease, like asthma or emphysema -pheochromocytoma -slow heart rate -thyroid disease -an unusual or allergic reaction to propranolol, other beta-blockers, medicines, foods, dyes, or preservatives -pregnant or trying to get pregnant -breast-feeding How should I use this medicine? Take this medicine by mouth with a glass of water. Follow the directions on the prescription label. Do not crush or chew. Take your doses at regular intervals. Do not take your medicine more often than directed. Do not stop taking except on the advice  of your doctor or health care professional. Talk to your pediatrician regarding the  use of this medicine in children. Special care may be needed. Overdosage: If you think you have taken too much of this medicine contact a poison control center or emergency room at once. NOTE: This medicine is only for you. Do not share this medicine with others. What if I miss a dose? If you miss a dose, take it as soon as you can. If it is almost time for your next dose, take only that dose. Do not take double or extra doses. What may interact with this medicine? Do not take this medicine with any of the following medications: -feverfew -phenothiazines like chlorpromazine, mesoridazine, prochlorperazine, thioridazine This medicine may also interact with the following medications: -aluminum hydroxide gel -antipyrine -antiviral medicines for HIV or AIDS -barbiturates like phenobarbital -certain medicines for blood pressure, heart disease, irregular heart beat -cimetidine -ciprofloxacin -diazepam -fluconazole -haloperidol -isoniazid -medicines for cholesterol like cholestyramine or colestipol -medicines for mental depression -medicines for migraine headache like almotriptan, eletriptan, frovatriptan, naratriptan, rizatriptan, sumatriptan, zolmitriptan -NSAIDs, medicines for pain and inflammation, like ibuprofen or naproxen -phenytoin -rifampin -teniposide -theophylline -thyroid medicines -tolbutamide -warfarin -zileuton This list may not describe all possible interactions. Give your health care provider a list of all the medicines, herbs, non-prescription drugs, or dietary supplements you use. Also tell them if you smoke, drink alcohol, or use illegal drugs. Some items may interact with your medicine. What should I watch for while using this medicine? Visit your doctor or health care professional for regular check ups. Contact your doctor right away if your symptoms worsen. Check your blood pressure and pulse rate regularly. Ask your health care professional what your blood pressure  and pulse rate should be, and when you should contact them. Do not stop taking this medicine suddenly. This could lead to serious heart-related effects. You may get drowsy or dizzy. Do not drive, use machinery, or do anything that needs mental alertness until you know how this drug affects you. Do not stand or sit up quickly, especially if you are an older patient. This reduces the risk of dizzy or fainting spells. Alcohol can make you more drowsy and dizzy. Avoid alcoholic drinks. This medicine can affect blood sugar levels. If you have diabetes, check with your doctor or health care professional before you change your diet or the dose of your diabetic medicine. Do not treat yourself for coughs, colds, or pain while you are taking this medicine without asking your doctor or health care professional for advice. Some ingredients may increase your blood pressure. What side effects may I notice from receiving this medicine? Side effects that you should report to your doctor or health care professional as soon as possible: -allergic reactions like skin rash, itching or hives, swelling of the face, lips, or tongue -breathing problems -changes in blood sugar -cold hands or feet -difficulty sleeping, nightmares -dry peeling skin -hallucinations -muscle cramps or weakness -slow heart rate -swelling of the legs and ankles -vomiting Side effects that usually do not require medical attention (report to your doctor or health care professional if they continue or are bothersome): -change in sex drive or performance -diarrhea -dry sore eyes -hair loss -nausea -weak or tired This list may not describe all possible side effects. Call your doctor for medical advice about side effects. You may report side effects to FDA at 1-800-FDA-1088. Where should I keep my medicine? Keep out of the reach of children. Store at room temperature  between 15 and 30 degrees C (59 and 86 degrees F). Protect from light,  moisture and freezing. Keep container tightly closed. Throw away any unused medicine after the expiration date. NOTE: This sheet is a summary. It may not cover all possible information. If you have questions about this medicine, talk to your doctor, pharmacist, or health care provider.    2016, [employer]. (2013-04-03 14:58:56)

## 2015-09-19 NOTE — Progress Notes (Signed)
Provider:  Melvyn Novas, M D  Referring Provider: Collene Gobble, MD Primary Care Physician:  Lucilla Edin, MD  Chief Complaint  Patient presents with  . Follow-up    not sleeping well, husband notices that pt twitches at night, migraines are worse, rm 10, aloone    HPI:  [name] is a 30 y.o. female , was originaly  seen here as a referral from Dr. Cleta Alberts for a seizure evaluation.  [name] stated today that when she was 30 years old she had a couple of seizures that were attributed to the use of Wellbutrin at the time. She never had any after discontinuing the medication and would not be considered epileptic, due to the provoked nature of the event.  Now at age 17 she presents with sensory abnormalities associated with nausea- the onset was in the middle of sleep at night. She awoke about 1:30 AM and felt severely nauseated but she could go back to sleep. When she again woke up at 7 AM her regular time to rise she had noticed a very bad headache that she describes as dull and generalized nonfocal not sharp not stabbing and not throbbing. She also felt that her vision was blurred and her left hand was asleep, she. The numbness lasted about half an hour the blurred vision may have lasted longer as did the headache. She felt quite tired and still had latent nausea throughout the day following.  On Easter Sunday she experienced a very different spell at church she felt that she could not move for mild and she felt a sense of doom of confusion of feeling overwhelmed. This was followed by a headache. Also she states she knew that she was in a familiar church she was still not feeling as if the environment was the same as usual. She also stated that she had attempted to speak but couldn't get the words out. She has only one history of a head injury this occurred when she was 30 years old and she had a seizure while walking down a driveway. Apparently her had fits a cemented prednisone  at the time she was not diagnosed with concussion or contusion. She has no family history of seizures or epilepsy. her primary care physician is  Dr. Dallas Schimke. She has not used any other over-the-counter remedy or prescription medication for her spells so far. [name] had a tubal ligation and is not on hormonal birth control. She has 2 children, the second pregnancy was associated with gas station of diabetes. Her son however was born per ceasarean  at normal weight and gestational age. She is a nonsmoker, lifelong. She does rarely drink alcohol. The patient is currently treated with Zoloft and takes at bedtime Ativan 1 mg. She has only one allergy to prescription medication,  Amoxicillin.  11-15-14 I me today with [name] her husband to review the EEG and MRI results. [name] EEG begun with a strong photosensitivity which we also see an migraine nurse. This is a photic entrainment went up to 17 Hz she reached sleep and she reached drowsiness 4. Several minutes during the 40 minute duration of the study. 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  However I cannot say that occipital sharp waves would be associated with nocturnal seizures to my  knowledge. 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 I would recommend for the patient not to take Wellbutrin or its relatives ever again I also discussed with her that alcohol and sleep deprivation can lower the seizure threshold. MRI normal- arnold chiari  Mildest form, 2 -3 mm , not a clinical significant finding.  I will concentrate on treating her primary insomnia. Benadryl and unsiom caused a hang over. ambien caused  Motor delay.  Mental fog.  Belsomra will be my PLAN A.  I will try Lunesta if her insurance cannot cover the medication.  Belsomra samples given , possible depression early  arousals.  MRi ,EEG do not explain her symptoms.  PSG ordered.   02-02-15  [name]  reports still fragmented sleep and problems with insomnia. Her sleep study performed on 01-12-15 documented an apnea index of 4.5, which is a normal range. Her RDI was 9.9 indicating some upper airway resistance he and snoring is present this was significantly dependent on her sleep position. While sleeping briefly on her back her apnea index increased to 10.9 while in nonsupine position at 1.7. Her asleep minimum average oxygen saturation was 95% there was no significant desaturation. Her heart rate was in normal sinus rhythm and she had 0 periodic limb movements. She wakes up frequently and this vivid dreams. She reports that she is sometimes so exhausted from her dreams that are very vivid and natural to her that she wakes up sweat drenched. 2 of her arousals occurred directly out of REM sleep but there was no heart rate elevations suggesting that she was in any physiological stress. Today's Epworth sleepiness score is endorsed at 15 points and her fatigue severity at 49 points. I've also asked her how she did was a Bellsombra medication and she felt that it was not in any way beneficial for her. She has also failed by using Unisom. She had tried Ambien before did not report any sleep activity.  Her primary care physician recently obtained a CBC and a metabolic panel. Electrolytes were also checked. Given that she has a fairly fragmented nighttime sleep with vivid dreams and is excessively daytime sleepy and she reports it was upsetting emotions she can feel weak not necessarily dropping to the floor beds losing some control of her muscle strength and motor function. She has frequent headaches and this was one of the reasons related to sleep study. These were not explained by the sleep study. I consider that she may have other triggers including processed food triggers are already reduced. She is keeping dietary log. I  would like for the patient to avoid sauerkraut, mature and blue cheese, red wine, caffeine, cured meats.  She has already discussed alcohol cessation, and she implemented this. She has neck tension and the feeling of a vice around the forehead.    09-19-15 My established patient, [name], is returning today with a complaint of #1 headaches, #2 insomnia #3 trembling at sleep onset.  The patient describes headaches that can easily last all day if not captured early by triptan. Her headaches are associated with nausea and vomiting and photophobia. She has had status migrainosus in the past she is currently treating headaches with Imitrex. She is not on a preventative medication right now. She has seen her optometrist was concerned about glaucoma but has not shared with her if he found elevated intraocular pressures. I like to refer the patient to an ophthalmologist to answer the question.   #2 her insomnia  has been a chronic condition she is treated with Ambien but has felt less effect. This habituation is to some degree expected. She has not reported sleep walking or night terrors. She averages usually 4 hours of sleep before she wakes up almost around 2 to 4 AM , often leaves the bed and distract herself and then returns to sleep for another 90 minutes. She is coming close to 6 hours of nocturnal sleep however. I like to change the medication, and will use trazodone. 100 mg at night, beginning at 50 mg.   # 3 trembling at sleep onset. She is unaware, her family witnessed it. A trembling in both hands, she feels "jumpy" when she wakes up.  She is restless in her sleep.   Review of Systems: Out of a complete 14 system review, the patient complains of only the following symptoms, and all other reviewed systems are negative. Nausea , headache , blurred vision, dull HA> nape of the neck tenderness,  right arm numbness.   Her head hurts up to 24 hours, nausea, vomiting , visual changes, "halo"  and  blurred vision.   Social History   Social History  . Marital Status: Married    Spouse Name: [name]  . Number of Children: 2  . Years of Education: 2 y collg   Occupational History  .      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 Smoking status: Never Smoker   . Smokeless tobacco: Not on file  . Alcohol Use: No  . Drug Use: No  . Sexual Activity: Not on file   Other Topics Concern  . Not on file   Social History Narrative   Caffeine 1 cup daily.    Family History  Problem Relation Age of Onset  . Cancer Other   . Diverticulitis Father   . Alzheimer's disease Paternal Grandmother   . High Cholesterol Mother   . Pancreatic cancer Maternal Grandmother   . Colon cancer Paternal Grandfather     Past Medical History  Diagnosis Date  . GDM (gestational diabetes mellitus)   . Seizures (HCC) 2005    side effect of wellbutrin-d/c  . Depression   . Anxiety   . Headache     Past Surgical History  Procedure Laterality Date  . Cesarean section  04-19-07, 05/15/12  . Cervical biopsy  w/ loop electrode excision  03/2011  . Fracture surgery      Current Outpatient Prescriptions  Medication Sig Dispense Refill  . SUMAtriptan (IMITREX) 25 MG tablet Take 1 tablet (25 mg total) by mouth every 2 (two) hours as needed for migraine. May repeat in 2 hours if headache persists or recurs. 10 tablet 1  . zolpidem (AMBIEN) 10 MG tablet Take 1 tablet (10 mg total) by mouth at bedtime as needed for sleep. 30 tablet 1   No current facility-administered medications for this visit.    Allergies as of 09/19/2015 - Review Complete 09/19/2015  Allergen Reaction Noted  . Amoxicillin  09/15/2013  . Latex Swelling and Rash 10/16/2011    Vitals: BP 128/76 mmHg  Pulse 80  Resp 20  Ht  (1.626 m)  Wt 142 lb (64.411 kg)  BMI 24.36 kg/m2 Last Weight:  Wt Readings from Last 1 Encounters:  09/19/15 142 lb (64.411 kg)   Last Height:   Ht Readings from Last 1  Encounters:  09/19/15  (1.626 m)    Physical exam:  General: The patient is  awake, alert and appears not in acute distress. The patient is well groomed. Head: Normocephalic, atraumatic. Neck is supple. Mallampati 3 , neck circumference: 13.5  Cardiovascular:  Regular rate and rhythm , without  murmurs or carotid bruit, and without distended neck veins. Respiratory: Lungs are clear to auscultation. Skin:  Without evidence of edema, or rash Trunk: normal posture.  Neurologic exam : The patient is awake and alert, oriented to place and time.   Memory subjective described as intact.  Cranial nerves: Pupils are equal and briskly reactive to light.  Visual fields by finger perimetry are intact. Hearing to finger rub intact.   Facial sensation intact to fine touch. Facial motor strength is symmetric and tongue and uvula move midline. Tongue protrusion into either cheek is normal. Shoulder shrug is normal.   Motor exam: Tone, muscle bulk and symmetric strength - no abnormality of tone.. Sensory:  Fine touch, pinprick and vibration were tested in all extremities.  Coordination: Rapid alternating movements in the fingers/hands were normal.  Finger-to-nose maneuver normal without evidence of ataxia, dysmetria or tremor. Gait and station: Patient walks without assistive device and is able unassisted to climb up to the exam table.  Strength within normal limits. Stance is stable and normal. Tandem gait is unfragmented. Romberg testing is negative  Deep tendon reflexes: in the  upper and lower extremities are symmetric and intact. Babinski maneuver response is downgoing. Assessment:  After physical and neurologic examination, review of laboratory studies, imaging, neurophysiology testing and pre-existing records, assessment is that of :  [name] neurologic exam is completely nonfocal.  She has normal muscle tone, coordination there is no ataxia or dysmetria noted no tremor seen.  She has  equal strength in her upper and lower extremities she was able to perform a tandem gait.    Plan:  Treatment plan and additional workup :  1) I will order trazodone from a stool to improve the insomnia. I have explained that Ambien usually becomes less effective after several months of usage. In some cases be alternated to sleep medications. I will prescribe trazodone at 50 Mg tablets if generically available. She is allowed 3-4 days on trazodone and in 2-3 month we may use Ambien again for 1-2 days.   2) Status migrainosus, start Propranolol low dose for night time use.  Refilled her imitrex.   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    Rv : with NP in 2 month , if HA not improved will send to Dr Lucia Gaskins.   Porfirio Mylar Olympia Adelsberger MD 09/19/2015   Melvyn Novas, MD

## 2015-11-14 ENCOUNTER — Encounter: Payer: Self-pay | Admitting: Neurology

## 2015-11-14 ENCOUNTER — Ambulatory Visit (INDEPENDENT_AMBULATORY_CARE_PROVIDER_SITE_OTHER): Payer: 59 | Admitting: Neurology

## 2015-11-14 VITALS — BP 126/82 | HR 78 | Resp 20 | Ht 64.0 in | Wt 147.0 lb

## 2015-11-14 DIAGNOSIS — G43101 Migraine with aura, not intractable, with status migrainosus: Secondary | ICD-10-CM | POA: Diagnosis not present

## 2015-11-14 DIAGNOSIS — M25541 Pain in joints of right hand: Secondary | ICD-10-CM | POA: Diagnosis not present

## 2015-11-14 DIAGNOSIS — G4700 Insomnia, unspecified: Secondary | ICD-10-CM

## 2015-11-14 DIAGNOSIS — M25542 Pain in joints of left hand: Secondary | ICD-10-CM | POA: Diagnosis not present

## 2015-11-14 MED ORDER — TRAZODONE HCL 50 MG PO TABS: ORAL_TABLET | ORAL | Status: DC

## 2015-11-14 MED ORDER — SUMATRIPTAN 20 MG/ACT NA SOLN: 20.0000 mg | NASAL | Status: DC | PRN

## 2015-11-14 NOTE — Patient Instructions (Signed)
Place rheumatology generic visit patient instructions here.   Joint Pain Joint pain, which is also called arthralgia, can be caused by many things. Joint pain often goes away when you follow your health care provider's instructions for relieving pain at home. However, joint pain can also be caused by conditions that require further treatment. Common causes of joint pain include:  Bruising in the area of the joint.  Overuse of the joint.  Wear and tear on the joints that occur with aging (osteoarthritis).  Various other forms of arthritis.  A buildup of a crystal form of uric acid in the joint (gout).  Infections of the joint (septic arthritis) or of the bone (osteomyelitis). Your health care provider may recommend medicine to help with the pain. If your joint pain continues, additional tests may be needed to diagnose your condition. HOME CARE INSTRUCTIONS Watch your condition for any changes. Follow these instructions as directed to lessen the pain that you are feeling.  Take medicines only as directed by your health care provider.  Rest the affected area for as long as your health care provider says that you should. If directed to do so, raise the painful joint above the level of your heart while you are sitting or lying down.  Do not do things that cause or worsen pain.  If directed, apply ice to the painful area:  Put ice in a plastic bag.  Place a towel between your skin and the bag.  Leave the ice on for 20 minutes, 2-3 times per day.  Wear an elastic bandage, splint, or sling as directed by your health care provider. Loosen the elastic bandage or splint if your fingers or toes become numb and tingle, or if they turn cold and blue.  Begin exercising or stretching the affected area as directed by your health care provider. Ask your health care provider what types of exercise are safe for you.  Keep all follow-up visits as directed by your health care provider. This is  important. SEEK MEDICAL CARE IF:  Your pain increases, and medicine does not help.  Your joint pain does not improve within 3 days.  You have increased bruising or swelling.  You have a fever.  You lose 10 lb (4.5 kg) or more without trying. SEEK IMMEDIATE MEDICAL CARE IF:  You are not able to move the joint.  Your fingers or toes become numb or they turn cold and blue.   This information is not intended to replace advice given to you by your health care provider. Make sure you discuss any questions you have with your health care provider.   Document Released: 07/30/2005 Document Revised: 08/20/2014 Document Reviewed: 05/11/2014 [employer] Interactive Patient Education [employer]2016 [employer]

## 2015-11-14 NOTE — Progress Notes (Signed)
Provider:  Melvyn Novas, M D  Referring Provider: Collene Gobble, MD Primary Care Physician:  Lucilla Edin, MD  Chief Complaint  Patient presents with  . Follow-up    insomnia worse, hasn't been usin ambien, only trazodone, rm 11 with husband and son    HPI:  [name] is a 30 y.o. female , was originaly  seen here as a referral from Dr. Cleta Alberts for a seizure evaluation.  In the meantime we have discussed chronic insomnia, but also she reports a new phenomenon of bilateral hand dysesthesia, burning pain. She describes a burning pain as being there constantly perhaps waxing and waning but not ever going away, the left hand is more intense than the right, she is right-hand dominant. She uses her hands a lot the pain increases. He feels as if there is a deep inside aching and burning sensation not that the palms are involved mouth at the skin is involved. And again there is no visible sign of any discoloration or swelling. She reports neither foot more ankle pain and has also no TMJ pain. She continues to exercise regularly she is an active lifestyle.  No cyanosis. No puffiness. No rash, but she has an eruption in the right elbow- Psoriasis. ?  She has had no longer Migraines as she stays away from blue cheese and roasted nuts. Trazodone and beta blocker.   HPI:  [name] stated  she was 30 years old whenshe had a couple of seizures that were attributed to the use of Wellbutrin at the time. She never had any after discontinuing the medication and would not be considered epileptic, due to the provoked nature of the event.Now at age 64 she presents with sensory abnormalities associated with nausea- the onset was in the middle of sleep at night. She awoke about 1:30 AM and felt severely nauseated but she could go back to sleep. When she again woke up at 7 AM her regular time to rise she had noticed a very bad headache that she describes as dull and generalized nonfocal not sharp not  stabbing and not throbbing. She also felt that her vision was blurred and her left hand was asleep, she. The numbness lasted about half an hour the blurred vision may have lasted longer as did the headache. She felt quite tired and still had latent nausea throughout the day following.  On Easter Sunday 2016 she experienced a very different spell at church she felt that she could not move for mild and she felt a sense of doom of confusion of feeling overwhelmed. This was followed by a headache. Also she states she knew that she was in a familiar church she was still not feeling as if the environment was the same as usual.  She also stated that she had attempted to speak but couldn't get the words out. She has only one history of a head injury this occurred when she was 30 years old and she had a seizure while walking down a driveway. Apparently her had fits a cemented prednisone at the time she was not diagnosed with concussion or contusion. She has no family history of seizures or epilepsy. her primary care physician is  Dr. Dallas Schimke. She has not used any other over-the-counter remedy or prescription medication for her spells so far. [name] had a tubal ligation and is not on hormonal birth control. She has 2 children, the second pregnancy was associated with gestational diabetes. Her son  was born by ceasarean at normal weight and gestational age. She is a nonsmoker, lifelong. She does rarely drink alcohol. The patient is currently treated with Zoloft and takes at bedtime Ativan 1 mg. She has only one allergy to prescription medication,  Amoxicillin.  11-15-14 I me today with [name] her husband to review the EEG and MRI results. [name] EEG begun with a strong photosensitivity which we also see an migraine nurse. This is a photic entrainment went up to 17 Hz she reached sleep and she reached drowsiness 4. Several minutes during the 40 minute duration of the study. 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  However I cannot say that occipital sharp waves would be associated with nocturnal seizures to my knowledge. 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 I would recommend for the patient not to take Wellbutrin or its relatives ever again I also discussed with her that alcohol and sleep deprivation can lower the seizure threshold. MRI normal- arnold chiari  Mildest form, 2 -3 mm , not a clinical significant finding.  I will concentrate on treating her primary insomnia. Benadryl and unsiom caused a hang over. ambien caused  Motor delay.  Mental fog.  Belsomra will be my PLAN A.  I will try Lunesta if her insurance cannot cover the medication.  Belsomra samples given , possible depression early arousals.  MRi ,EEG do not explain her symptoms. PSG ordered.   02-02-15  [name]  reports still fragmented sleep and problems with insomnia. Her sleep study performed on 01-12-15 documented an apnea index of 4.5, which is a normal range. Her RDI was 9.9 indicating some upper airway resistance he and snoring is present this was significantly dependent on her sleep position. While sleeping briefly on her back her apnea index increased to 10.9 while in nonsupine position at 1.7. Her asleep minimum average oxygen saturation was 95% there was no significant desaturation. Her heart rate was in normal sinus rhythm and she had 0 periodic limb movements. She wakes up frequently and this vivid dreams. She reports that she is sometimes so exhausted from her dreams that are very vivid and natural to her that she wakes up sweat drenched. 2 of her arousals occurred directly out of REM sleep but there was no heart rate elevations suggesting that she was in any physiological stress. Today's Epworth sleepiness score is endorsed at 15 points and her fatigue  severity at 49 points. I've also asked her how she did was a Bellsombra medication and she felt that it was not in any way beneficial for her. She has also failed by using Unisom. She had tried Ambien before did not report any sleep activity.  Her primary care physician recently obtained a CBC and a metabolic panel. Electrolytes were also checked. Given that she has a fairly fragmented nighttime sleep with vivid dreams and is excessively daytime sleepy and she reports it was upsetting emotions she can feel weak not necessarily dropping to the floor beds losing some control of her muscle strength and motor function. She has frequent headaches and this was one of the reasons related to sleep study. These were not explained by the sleep study. I consider that she may have other triggers including processed food triggers are already reduced. She is keeping dietary log. I would  like for the patient to avoid sauerkraut, mature and blue cheese, red wine, caffeine, cured meats.  She has already discussed alcohol cessation, and she implemented this. She has neck tension and the feeling of a vice around the forehead.    09-19-15 My established patient, [name], is returning today with a complaint of #1 headaches, #2 insomnia #3 trembling at sleep onset.  The patient describes headaches that can easily last all day if not captured early by triptan. Her headaches are associated with nausea and vomiting and photophobia. She has had status migrainosus in the past she is currently treating headaches with Imitrex. She is not on a preventative medication right now. She has seen her optometrist was concerned about glaucoma but has not shared with her if he found elevated intraocular pressures. I like to refer the patient to an ophthalmologist to answer the question.   #2 her insomnia has been a chronic condition she is treated with Ambien but has felt less effect. This habituation is to some degree expected. She has not  reported sleep walking or night terrors. She averages usually 4 hours of sleep before she wakes up almost around 2 to 4 AM , often leaves the bed and distract herself and then returns to sleep for another 90 minutes. She is coming close to 6 hours of nocturnal sleep however. I like to change the medication, and will use trazodone. 100 mg at night, beginning at 50 mg.   # 3 trembling at sleep onset.  She is unaware, her family witnessed it. A trembling in both hands, she feels "jumpy" when she wakes up.  She is restless in her sleep.   Review of Systems: Out of a complete 14 system review, the patient complains of only the following symptoms, and all other reviewed systems are negative. Nausea , headache , blurred vision, dull HA> nape of the neck tenderness,  right arm numbness.   Her head hurts up to 24 hours, nausea, vomiting , visual changes, "halo"  and blurred vision.   Social History   Social History  . Marital Status: Married    Spouse Name: [name]  . Number of Children: 2  . Years of Education: 2 y collg   Occupational History  .      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 Smoking status: Never Smoker   . Smokeless tobacco: Not on file  . Alcohol Use: No  . Drug Use: No  . Sexual Activity: Not on file   Other Topics Concern  . Not on file   Social History Narrative   Caffeine 1 cup daily.    Family History  Problem Relation Age of Onset  . Cancer Other   . Diverticulitis Father   . Alzheimer's disease Paternal Grandmother   . High Cholesterol Mother   . Pancreatic cancer Maternal Grandmother   . Colon cancer Paternal Grandfather     Past Medical History  Diagnosis Date  . GDM (gestational diabetes mellitus)   . Seizures (HCC) 2005    side effect of wellbutrin-d/c  . Depression   . Anxiety   . Headache     Past Surgical History  Procedure Laterality Date  . Cesarean section  04-19-07, 05/15/12  . Cervical biopsy  w/ loop electrode  excision  03/2011  . Fracture surgery      Current Outpatient Prescriptions  Medication Sig Dispense Refill  . propranolol ER (INDERAL LA) 60 MG 24 hr capsule Take  1 capsule (60 mg total) by mouth daily. 30 capsule 3  . SUMAtriptan (IMITREX) 25 MG tablet Take 1 tablet (25 mg total) by mouth every 2 (two) hours as needed for migraine. May repeat in 2 hours if headache persists or recurs. 10 tablet 1  . traZODone (DESYREL) 50 MG tablet May take 1 at night for 8 days, if insufficient response increase to 2 tabs HS prn. 60 tablet 5  . zolpidem (AMBIEN) 10 MG tablet Take 1 tablet (10 mg total) by mouth at bedtime as needed for sleep. 15 tablet 0   No current facility-administered medications for this visit.    Allergies as of 11/14/2015 - Review Complete 11/14/2015  Allergen Reaction Noted  . Amoxicillin  09/15/2013  . Latex Swelling and Rash 10/16/2011    Vitals: BP 126/82 mmHg  Pulse 78  Resp 20  Ht 5\' 4"  (1.626 m)  Wt 147 lb (66.679 kg)  BMI 25.22 kg/m2 Last Weight:  Wt Readings from Last 1 Encounters:  11/14/15 147 lb (66.679 kg)   Last Height:   Ht Readings from Last 1 Encounters:  11/14/15 5\' 4"  (1.626 m)    Physical exam:  General: The patient is awake, alert and appears not in acute distress. The patient is well groomed. Head: Normocephalic, atraumatic. Neck is supple. Mallampati 3 , neck circumference: 13.5  Cardiovascular:  Regular rate and rhythm , without  murmurs or carotid bruit, and without distended neck veins. Respiratory: Lungs are clear to auscultation. Skin:  Without evidence of edema, or rash Trunk: normal posture.  Neurologic exam : The patient is awake and alert, oriented to place and time.   Memory subjective described as intact.  Cranial nerves: Pupils are equal and briskly reactive to light.  Visual fields by finger perimetry are intact. Hearing to finger rub intact.   Facial sensation intact to fine touch. Facial motor strength is symmetric and  tongue and uvula move midline. Tongue protrusion into either cheek is normal. Shoulder shrug is normal.   Motor exam: Tone, muscle bulk and symmetric strength - no abnormality of tone.. Sensory:  Fine touch, pinprick and vibration were tested in all extremities.  Coordination: Rapid alternating movements in the fingers/hands were normal.  Finger-to-nose maneuver normal without evidence of ataxia, dysmetria or tremor. Gait and station: Patient walks without assistive device and is able unassisted to climb up to the exam table.  Strength within normal limits. Stance is stable and normal. Tandem gait is unfragmented. Romberg testing is negative  Deep tendon reflexes: in the  upper and lower extremities are symmetric and intact. Babinski maneuver response is downgoing. Assessment:  After physical and neurologic examination, review of laboratory studies, imaging, neurophysiology testing and pre-existing records, assessment is that of :  [name] neurologic exam is completely nonfocal.  She has normal muscle tone, coordination there is no ataxia or dysmetria noted no tremor seen.  She has equal strength in her upper and lower extremities she was able to perform a tandem gait.    Plan:  Treatment plan and additional workup :  1) I will order trazodone from a stool to improve the insomnia. I have explained that Ambien usually becomes less effective after several months of usage. In some cases be alternated to sleep medications. I will prescribe trazodone at 50 Mg tablets if generically available. She is allowed 3-4 days on trazodone and in 2-3 month we may use Ambien again for 1-2 days.   2) Status migrainosus, start Propranolol low dose  for night time use.  Refilled her imitrex.   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    Rv : with NP in 2 month , if HA not improved will send to Dr Lucia Gaskins.  I have asked Dr. Lucia Gaskins to help  with a neuropathy work up for the new compliant of burning hand pain, right over left.   Porfirio Mylar Kyliegh Jester MD 11/14/2015

## 2015-11-16 ENCOUNTER — Encounter: Payer: Self-pay | Admitting: *Deleted

## 2016-05-15 ENCOUNTER — Ambulatory Visit: Payer: 59 | Admitting: Neurology

## 2016-07-03 ENCOUNTER — Ambulatory Visit (INDEPENDENT_AMBULATORY_CARE_PROVIDER_SITE_OTHER): Payer: 59 | Admitting: Neurology

## 2016-07-03 ENCOUNTER — Telehealth: Payer: Self-pay | Admitting: Neurology

## 2016-07-03 ENCOUNTER — Encounter: Payer: Self-pay | Admitting: Neurology

## 2016-07-03 VITALS — BP 118/77 | HR 89 | Resp 20 | Ht 64.0 in | Wt 153.0 lb

## 2016-07-03 DIAGNOSIS — F5104 Psychophysiologic insomnia: Secondary | ICD-10-CM

## 2016-07-03 DIAGNOSIS — G43111 Migraine with aura, intractable, with status migrainosus: Secondary | ICD-10-CM

## 2016-07-03 DIAGNOSIS — G4719 Other hypersomnia: Secondary | ICD-10-CM | POA: Diagnosis not present

## 2016-07-03 DIAGNOSIS — M79641 Pain in right hand: Secondary | ICD-10-CM | POA: Diagnosis not present

## 2016-07-03 DIAGNOSIS — G4710 Hypersomnia, unspecified: Secondary | ICD-10-CM

## 2016-07-03 DIAGNOSIS — R278 Other lack of coordination: Secondary | ICD-10-CM

## 2016-07-03 DIAGNOSIS — M79642 Pain in left hand: Secondary | ICD-10-CM | POA: Diagnosis not present

## 2016-07-03 MED ORDER — PROPRANOLOL HCL ER 60 MG PO CP24: 60.0000 mg | ORAL_CAPSULE | Freq: Every day | ORAL | 3 refills | Status: DC

## 2016-07-03 MED ORDER — SUMATRIPTAN SUCCINATE 25 MG PO TABS: 25.0000 mg | ORAL_TABLET | ORAL | 1 refills | Status: DC | PRN

## 2016-07-03 MED ORDER — ZOLPIDEM TARTRATE 10 MG PO TABS: 10.0000 mg | ORAL_TABLET | Freq: Every evening | ORAL | 0 refills | Status: DC | PRN

## 2016-07-03 MED ORDER — TRAZODONE HCL 50 MG PO TABS: ORAL_TABLET | ORAL | 5 refills | Status: DC

## 2016-07-03 MED ORDER — SUMATRIPTAN 20 MG/ACT NA SOLN: 20.0000 mg | NASAL | 0 refills | Status: DC | PRN

## 2016-07-03 NOTE — Addendum Note (Signed)
Addended by: Geronimo RunningINKINS, Benedetto Ryder A on: 07/03/2016 10:39 AM   Modules accepted: Orders

## 2016-07-03 NOTE — Telephone Encounter (Signed)
It appears that Dr. Vickey Hugerohmeier refilled the imitrex nasal spray at today's visit. I refilled the imitrex 25 mg tablet prn for the pt as well.  Do you want pt to have both the nasal spray and the tablet of imitrex?

## 2016-07-03 NOTE — Telephone Encounter (Signed)
Yes, let's do it in this calendar year. CD

## 2016-07-03 NOTE — Progress Notes (Signed)
Provider:  Melvyn Novas, M D  Referring Provider: Collene Gobble, MD Primary Care Physician:  Lucilla Edin, MD  Chief Complaint  Patient presents with  . Follow-up    insomnia, having good and bad days    HPI:  [name] is a 30 y.o. female , was originaly  seen here as a referral from Dr. Cleta Alberts for a seizure evaluation.  In the meantime we have discussed chronic insomnia, but also she reports a new phenomenon of bilateral hand dysesthesia, burning pain. She describes a burning pain as being there constantly perhaps waxing and waning but not ever going away, the left hand is more intense than the right, she is right-hand dominant. She uses her hands a lot the pain increases. He feels as if there is a deep inside aching and burning sensation not that the palms are involved mouth at the skin is involved. And again there is no visible sign of any discoloration or swelling. She reports neither foot more ankle pain and has also no TMJ pain. She continues to exercise regularly she is an active lifestyle.  No cyanosis. No puffiness. No rash, but she has an eruption in the right elbow- Psoriasis. ?  She has had no longer Migraines as she stays away from blue cheese and roasted nuts. Trazodone and beta blocker.   HPI:  [name] stated  she was 30 years old whenshe had a couple of seizures that were attributed to the use of Wellbutrin at the time. She never had any after discontinuing the medication and would not be considered epileptic, due to the provoked nature of the event.Now at age 54 she presents with sensory abnormalities associated with nausea- the onset was in the middle of sleep at night. She awoke about 1:30 AM and felt severely nauseated but she could go back to sleep. When she again woke up at 7 AM her regular time to rise she had noticed a very bad headache that she describes as dull and generalized nonfocal not sharp not stabbing and not throbbing. She also felt that her  vision was blurred and her left hand was asleep, she. The numbness lasted about half an hour the blurred vision may have lasted longer as did the headache. She felt quite tired and still had latent nausea throughout the day following.  On Easter Sunday 2016 she experienced a very different spell at church she felt that she could not move for mild and she felt a sense of doom of confusion of feeling overwhelmed. This was followed by a headache. Also she states she knew that she was in a familiar church she was still not feeling as if the environment was the same as usual.  She also stated that she had attempted to speak but couldn't get the words out. She has only one history of a head injury this occurred when she was 30 years old and she had a seizure while walking down a driveway. Apparently her had fits a cemented prednisone at the time she was not diagnosed with concussion or contusion. She has no family history of seizures or epilepsy. her primary care physician is  Dr. Dallas Schimke. She has not used any other over-the-counter remedy or prescription medication for her spells so far. [name] had a tubal ligation and is not on hormonal birth control. She has 2 children, the second pregnancy was associated with gestational diabetes. Her son  was born by ceasarean at normal weight  and gestational age. She is a nonsmoker, lifelong. She does rarely drink alcohol. The patient is currently treated with Zoloft and takes at bedtime Ativan 1 mg. She has only one allergy to prescription medication,  Amoxicillin.   02-02-15  [name]  reports still fragmented sleep and problems with insomnia. Her sleep study performed on 01-12-15 documented an apnea index of 4.5, which is a normal range. Her RDI was 9.9 indicating some upper airway resistance he and snoring is present this was significantly dependent on her sleep position. While sleeping briefly on her back her apnea index increased to 10.9 while in nonsupine position  at 1.7. Her asleep minimum average oxygen saturation was 95% there was no significant desaturation. Her heart rate was in normal sinus rhythm and she had 0 periodic limb movements. She wakes up frequently and this vivid dreams. She reports that she is sometimes so exhausted from her dreams that are very vivid and natural to her that she wakes up sweat drenched. 2 of her arousals occurred directly out of REM sleep but there was no heart rate elevations suggesting that she was in any physiological stress. Today's Epworth sleepiness score is endorsed at 15 points and her fatigue severity at 49 points. I've also asked her how she did was a Bellsombra medication and she felt that it was not in any way beneficial for her. She has also failed by using Unisom. She had tried Ambien before did not report any sleep activity. Her primary care physician recently obtained a CBC and a metabolic panel. Electrolytes were also checked. Given that she has a fairly fragmented nighttime sleep with vivid dreams and is excessively daytime sleepy and she reports it was upsetting emotions she can feel weak not necessarily dropping to the floor beds losing some control of her muscle strength and motor function. She has frequent headaches and this was one of the reasons related to sleep study. These were not explained by the sleep study. I consider that she may have other triggers including processed food triggers are already reduced. She is keeping dietary log. I would like for the patient to avoid sauerkraut, ripened and blue cheese, red wine, caffeine, cured meats, roasted nuts.  She has already discussed alcohol cessation, and she implemented this. She has neck tension and the feeling of a vice around the forehead.    09-19-15 My established patient, [name], is returning today with a complaint of #1 headaches, #2 insomnia #3 trembling at sleep onset. The patient describes headaches that can easily last all day if not captured  early by triptan. Her headaches are associated with nausea and vomiting and photophobia. She has had status migrainosus in the past she is currently treating headaches with Imitrex. She is not on a preventative medication right now. She has seen her optometrist was concerned about glaucoma but has not shared with her if he found elevated intraocular pressures. I like to refer the patient to an ophthalmologist to answer the question.   #2 her insomnia has been a chronic condition;  she is treated with Ambien but has felt less effect. This habituation is expected. She has not reported sleep walking or night terrors. She averages usually 4 hours of sleep before she wakes up almost around 2 to 4 AM , often leaves the bed and distract herself and then returns to sleep for another 90 minutes. She is coming close to 6 hours of nocturnal sleep however. I like to change the medication, and will use  trazodone. 100 mg at night, beginning at 50 mg.   # 3 trembling at sleep onset.  She is unaware, her family witnessed it. A trembling in both hands, she feels "jumpy" when she wakes up.  She is restless in her sleep.  Interval history from 07/03/2016, [name] still reports trembling at sleep onset, and pain, and treated insomnia- she has recognized that her stress level influences her insomnia. Ambien is used some nights of the week alternating with trazodone which has helped and keeps both medications effective for longer periods of time. Trazodone has caused some dry eye and dry mouth in the morning but also drooling at night. She will continued to take it. Her headaches have been chronic for the last 2 weeks and are clearly migrainous. She was out of Imitrex and needs a refill today. Imitrex still aborts the headaches. She only drinks every other month or so 1 glass of wine, this reduction has helped her headache frequency, too.     Review of Systems: Out of a complete 14 system review, the patient complains  of only the following symptoms, and all other reviewed systems are negative. Nausea , headache , blurred vision, dull HA, she has noted that she will be to her children and seems not to processed correctly content but she is able to speak the words clearly.  Her head hurts up to 24 hours, nausea, vomiting , visual changes, "halo"  and blurred vision.    Social History   Social History  . Marital status: Married    Spouse name: [name]  . Number of children: 2  . Years of education: 2 y collg   Occupational History  .      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 Smoking status: Never Smoker  . Smokeless tobacco: Not on file  . Alcohol use No  . Drug use: No  . Sexual activity: Not on file   Other Topics Concern  . Not on file   Social History Narrative   Caffeine 1 cup daily.    Family History  Problem Relation Age of Onset  . Cancer Other   . Diverticulitis Father   . Alzheimer's disease Paternal Grandmother   . High Cholesterol Mother   . Pancreatic cancer Maternal Grandmother   . Colon cancer Paternal Grandfather     Past Medical History:  Diagnosis Date  . Anxiety   . Depression   . GDM (gestational diabetes mellitus)   . Headache   . Seizures (HCC) 2005   side effect of wellbutrin-d/c    Past Surgical History:  Procedure Laterality Date  . CERVICAL BIOPSY  W/ LOOP ELECTRODE EXCISION  03/2011  . CESAREAN SECTION  04-19-07, 05/15/12  . FRACTURE SURGERY      Current Outpatient Prescriptions  Medication Sig Dispense Refill  . propranolol ER (INDERAL LA) 60 MG 24 hr capsule Take 1 capsule (60 mg total) by mouth daily. 30 capsule 3  . SUMAtriptan (IMITREX) 20 MG/ACT nasal spray Place 1 spray (20 mg total) into the nose every 2 (two) hours as needed for migraine or headache. May repeat in 2 hours if headache persists or recurs. 3 Inhaler 0  . SUMAtriptan (IMITREX) 25 MG tablet Take 1 tablet (25 mg total) by mouth every 2 (two) hours as needed for  migraine. May repeat in 2 hours if headache persists or recurs. 10 tablet 1  . traZODone (DESYREL) 50 MG tablet May take 1 at night  for 8 days, if insufficient response increase to 2 tabs HS prn. 60 tablet 5  . zolpidem (AMBIEN) 10 MG tablet Take 1 tablet (10 mg total) by mouth at bedtime as needed for sleep. 15 tablet 0   No current facility-administered medications for this visit.     Allergies as of 07/03/2016 - Review Complete 07/03/2016  Allergen Reaction Noted  . Amoxicillin  09/15/2013  . Latex Swelling and Rash 10/16/2011    Vitals: BP 118/77   Pulse 89   Resp 20   Ht 5\' 4"  (1.626 m)   Wt 153 lb (69.4 kg)   BMI 26.26 kg/m  Last Weight:  Wt Readings from Last 1 Encounters:  07/03/16 153 lb (69.4 kg)   Last Height:   Ht Readings from Last 1 Encounters:  07/03/16 5\' 4"  (1.626 m)    Physical exam:  General: The patient is awake, alert and appears not in acute distress. The patient is well groomed. Head: Normocephalic, atraumatic. Neck is supple. Mallampati 3 , neck circumference: 13.5  Cardiovascular:  Regular rate and rhythm , without  murmurs or carotid bruit, and without distended neck veins. Respiratory: Lungs are clear to auscultation. Skin:  Without evidence of edema, or rash Trunk: normal posture.  Neurologic exam : The patient is awake and alert, oriented to place and time.   Memory subjective described as intact.  Cranial nerves: Pupils are equal and briskly reactive to light.  Visual fields by finger perimetry are intact. No blind spots.  Hearing to finger rub intact.   Facial sensation intact to fine touch.  Facial motor strength is symmetric and tongue and uvula move midline.  Tongue protrusion into either cheek is normal. Shoulder shrug is normal.   Motor exam: Tone, muscle bulk and symmetric strength - no abnormality of tone.. Sensory:  Fine touch, pinprick and vibration were tested in all extremities.  Coordination: Rapid alternating movements in  the fingers/hands were normal.  Finger-to-nose maneuver normal without evidence of ataxia, dysmetria or tremor. Gait and station: Patient walks without assistive device and is able unassisted to climb up to the exam table.  Strength within normal limits. Stance is stable and normal. Tandem gait is unfragmented. Romberg testing is negative  Deep tendon reflexes: in the upper and lower extremities are symmetric and intact. Babinski maneuver response is downgoing. Assessment:  After physical and neurologic examination, review of laboratory studies, imaging, neurophysiology testing and pre-existing records, assessment is that of :  Mrs. Penny neurologic exam is completely nonfocal.  She has normal muscle tone, coordination there is no ataxia or dysmetria noted no tremor seen.  She has equal strength in her upper and lower extremities she was able to perform a tandem gait.    Plan:  Treatment plan and additional workup :  1) I will reorder trazodone to improve the insomnia. I have explained that Ambien usually becomes less effective after several months of usage. In some cases be alternated to sleep medications. I will prescribe trazodone at 50 mg tablets generically available. She is allowed 3-4 days on trazodone and in 2-3 month we may use Ambien again for 1-2 days.   2) Status migrainosus, start Propranolol low dose for night time use- it has treated her hand tremor effectively , too .  Refilled her imitrex. Familiar tremor as her mother has the same symptoms.   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 No treatment.  4)  Hand pain has been evaluated by Rheumatology- Dr. Dierdre Forth- and immune blood trest results were negative. I will ask for NCV and EMG . She reports subjective clumsiness and strength, has trouble opening a screw top.     Rv : refills  Today, NCV and EMG will send to Dr Lucia Gaskins. -I  have asked Dr. Lucia Gaskins to  help with a neuropathy work up for the persistent complaint of burning hand pain, right over left.   Porfirio Mylar Chevella Pearce MD 07/03/2016

## 2016-07-03 NOTE — Telephone Encounter (Signed)
RX for imitrex was refilled and sent to pt's pharmacy.

## 2016-07-03 NOTE — Patient Instructions (Signed)
July 03, 2016   [name] [address] [place] [id number]   Dear [name],  Your physician has ordered a Nerve Conduction Study (NCV) and/or EMG testing.  This is a test to assess the status of your nerves and muscles.  For the NCV portion of the test , sticky tabs will be placed on either your hands or feet.  Your nerves will be stimulated using small electrical charges and the speed of the impulse will be measured as it travels down the nerve.  For the EMG portion of t the test, a small pin will be placed below the surface of the skin to measure the electrical activity in certain muscles in your arms or legs.  Eating/drinking prior to testing is ok  Please DO NOT discontinue ANY medications prior to testing   You will need to check in with the main reception desk.  Your test could take approximately 30 minutes to 1 hour to complete depending on the extent of the test that has been ordered.  TESTING ON LEGS/BACK/HIP/FOOT AREA: Bring/wear a pair of shorts.  **ABSOLUTELY NO LOTIONS, MOISTURIZERS, VASELINE, OILS OR CREAMS OF ANY KIND ON ANY BODY PART THE DAY OF THE TEST**  **DEODORANT IS OK TO WEAR**  Please make every effort to keep your scheduled appointment time, as your physician needs the test results prior to your next appointment.  If you have to cancel or reschedule, please do so as soon as possible, so that another patient may use that testing time slot.  If you cancel your appointment, you may also need to reschedule your referring doctor's appointment until the test and results can be completed.  Please call [phone] if you need to do so.  If you have any questions at any time please let us know.  We look forward to working with you!!

## 2016-07-03 NOTE — Addendum Note (Signed)
Addended by: Melvyn NovasHMEIER, Diaz Crago on: 07/03/2016 01:21 PM   Modules accepted: Orders

## 2016-07-03 NOTE — Telephone Encounter (Signed)
Patient requesting refill of  SUMAtriptan (IMITREX) 25 MG tablet called to [facility] on [address]. She saw Dr. Vickey Hugerohmeier this morning but says this Rx was omitted from refills.

## 2016-07-03 NOTE — Telephone Encounter (Signed)
I spoke to Dr. Vickey Hugerohmeier and confirmed that pt is supposed to have imitrex nasal spray and imitrex tablet.

## 2016-07-11 ENCOUNTER — Ambulatory Visit (INDEPENDENT_AMBULATORY_CARE_PROVIDER_SITE_OTHER): Payer: 59 | Admitting: Neurology

## 2016-07-11 ENCOUNTER — Ambulatory Visit (INDEPENDENT_AMBULATORY_CARE_PROVIDER_SITE_OTHER): Payer: Self-pay | Admitting: Neurology

## 2016-07-11 DIAGNOSIS — M79641 Pain in right hand: Secondary | ICD-10-CM

## 2016-07-11 DIAGNOSIS — G4710 Hypersomnia, unspecified: Secondary | ICD-10-CM

## 2016-07-11 DIAGNOSIS — R202 Paresthesia of skin: Secondary | ICD-10-CM | POA: Diagnosis not present

## 2016-07-11 DIAGNOSIS — G43111 Migraine with aura, intractable, with status migrainosus: Secondary | ICD-10-CM

## 2016-07-11 DIAGNOSIS — M79642 Pain in left hand: Secondary | ICD-10-CM

## 2016-07-11 DIAGNOSIS — G4719 Other hypersomnia: Secondary | ICD-10-CM

## 2016-07-11 DIAGNOSIS — R278 Other lack of coordination: Secondary | ICD-10-CM

## 2016-07-11 NOTE — Progress Notes (Signed)
Please refer to EMG and nerve conduction study procedure note. 

## 2016-07-11 NOTE — Procedures (Signed)
     HISTORY:  [name] is a 30 year old patient with a history of intermittent paresthesias and burning sensations of the arms and legs that may come and go on average once a week. That may be some symptoms of heaviness of the extremities with the symptoms. The patient is being evaluated for these issues.  NERVE CONDUCTION STUDIES:  Nerve conduction studies were performed on both upper extremities. The distal motor latencies and motor amplitudes for the median and ulnar nerves were within normal limits. The F wave latencies and nerve conduction velocities for these nerves were also normal. The sensory latencies for the median and ulnar nerves were normal.  Nerve conduction studies were performed on the left lower extremity. The distal motor latencies and motor amplitudes for the peroneal and posterior tibial nerves were within normal limits. The nerve conduction velocities for these nerves were also normal. The H reflex latency was normal. The sensory latency for the peroneal nerve was within normal limits.   EMG STUDIES:  EMG study was performed on the left upper extremity:  The first dorsal interosseous muscle reveals 2 to 4 K units with full recruitment. No fibrillations or positive waves were noted. The abductor pollicis brevis muscle reveals 2 to 4 K units with full recruitment. No fibrillations or positive waves were noted. The extensor indicis proprius muscle reveals 1 to 3 K units with full recruitment. No fibrillations or positive waves were noted. The pronator teres muscle reveals 2 to 3 K units with full recruitment. No fibrillations or positive waves were noted. The biceps muscle reveals 1 to 2 K units with full recruitment. No fibrillations or positive waves were noted. The triceps muscle reveals 2 to 4 K units with full recruitment. No fibrillations or positive waves were noted. The anterior deltoid muscle reveals 2 to 3 K units with full recruitment. No fibrillations or  positive waves were noted. The cervical paraspinal muscles were tested at 2 levels. No abnormalities of insertional activity were seen at either level tested. There was fair relaxation.  EMG study was performed on the left lower extremity:  The tibialis anterior muscle reveals 2 to 4K motor units with full recruitment. No fibrillations or positive waves were seen. The peroneus tertius muscle reveals 2 to 4K motor units with full recruitment. No fibrillations or positive waves were seen. The medial gastrocnemius muscle reveals 1 to 3K motor units with full recruitment. No fibrillations or positive waves were seen. The vastus lateralis muscle reveals 2 to 4K motor units with full recruitment. No fibrillations or positive waves were seen. The iliopsoas muscle reveals 2 to 4K motor units with full recruitment. No fibrillations or positive waves were seen. The biceps femoris muscle (long head) reveals 2 to 4K motor units with full recruitment. No fibrillations or positive waves were seen. The lumbosacral paraspinal muscles were tested at 3 levels, and revealed no abnormalities of insertional activity at all 3 levels tested. There was good relaxation.   IMPRESSION:  Nerve conduction studies done on both upper extremities and on the left lower extremity were within normal limits. No evidence of a peripheral neuropathy was seen. EMG evaluation of the left upper and left lower extremities were unremarkable, without evidence of an overlying radiculopathy.  Marlan Palau. Keith Willis MD 07/11/2016 1:12 PM  [facility] [address] [place] [id number]  Phone [phone] Fax [phone]

## 2016-07-19 ENCOUNTER — Telehealth: Payer: Self-pay

## 2016-07-19 NOTE — Telephone Encounter (Signed)
-----   Message from Melvyn Novasarmen Dohmeier, MD sent at 07/17/2016  4:37 PM EST ----- All normal NCV and EMG. CD

## 2016-07-19 NOTE — Telephone Encounter (Signed)
Lm with results below.

## 2016-08-01 ENCOUNTER — Ambulatory Visit: Payer: 59 | Admitting: Adult Health

## 2016-08-07 ENCOUNTER — Encounter: Payer: Self-pay | Admitting: Physician Assistant

## 2016-08-07 ENCOUNTER — Ambulatory Visit (INDEPENDENT_AMBULATORY_CARE_PROVIDER_SITE_OTHER): Payer: 59 | Admitting: Physician Assistant

## 2016-08-07 VITALS — BP 140/85 | HR 97 | Temp 98.5°F | Resp 16 | Ht 64.0 in | Wt 150.0 lb

## 2016-08-07 DIAGNOSIS — R454 Irritability and anger: Secondary | ICD-10-CM | POA: Diagnosis not present

## 2016-08-07 DIAGNOSIS — F329 Major depressive disorder, single episode, unspecified: Secondary | ICD-10-CM

## 2016-08-07 DIAGNOSIS — F32A Depression, unspecified: Secondary | ICD-10-CM

## 2016-08-07 DIAGNOSIS — Z23 Encounter for immunization: Secondary | ICD-10-CM | POA: Diagnosis not present

## 2016-08-07 MED ORDER — RANITIDINE HCL 150 MG PO TABS: 150.0000 mg | ORAL_TABLET | Freq: Two times a day (BID) | ORAL | 0 refills | Status: DC

## 2016-08-07 NOTE — Patient Instructions (Addendum)
I will reach out to Dr. Vickey Hugerohmeier regarding their recommendations for treatment of the anger, as they are all anti-seizure medications. One of the 2 of us will contact you with our recommendations.  The ranitidine should help with the nausea, the cough and perhaps the sore throat.    IF you received an x-ray today, you will receive an invoice from [facility]. Please contact [facility] at [phone] with questions or concerns regarding your invoice.   IF you received labwork today, you will receive an invoice from [facility]. Please contact [facility] at [phone] with questions or concerns regarding your invoice.   Our billing staff will not be able to assist you with questions regarding bills from these companies.  You will be contacted with the lab results as soon as they are available. The fastest way to get your results is to activate your My Chart account. Instructions are located on the last page of this paperwork. If you have not heard from us regarding the results in 2 weeks, please contact this office.
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
## 2016-08-08 ENCOUNTER — Encounter: Payer: Self-pay | Admitting: Physician Assistant

## 2016-08-08 MED ORDER — LAMOTRIGINE 25 MG PO TABS: 25.0000 mg | ORAL_TABLET | Freq: Two times a day (BID) | ORAL | 1 refills | Status: DC

## 2016-08-08 NOTE — Progress Notes (Signed)
I agree with the assessment and plan as directed by PA [facility] . The patient is known to me . I would chose Lamictal, as it doesn't reqiure frequent blood tests and is well metabolized. The only time frame we have to be careful with is the first 6 weeks, as it can produce a form of Andria MeuseStevens- Johnson syndrome. Chelle< Would you like to start 25 mg bid for 6 weeks, and I will titrate after that time.?        Cynithia Hakimi, MD

## 2016-08-28 ENCOUNTER — Ambulatory Visit: Payer: 59 | Admitting: Adult Health

## 2016-09-04 ENCOUNTER — Ambulatory Visit (INDEPENDENT_AMBULATORY_CARE_PROVIDER_SITE_OTHER): Payer: 59 | Admitting: Adult Health

## 2016-09-04 ENCOUNTER — Encounter: Payer: Self-pay | Admitting: Adult Health

## 2016-09-04 VITALS — BP 128/78 | HR 101 | Ht 64.0 in | Wt 149.0 lb

## 2016-09-04 DIAGNOSIS — G43009 Migraine without aura, not intractable, without status migrainosus: Secondary | ICD-10-CM | POA: Diagnosis not present

## 2016-09-04 DIAGNOSIS — R569 Unspecified convulsions: Secondary | ICD-10-CM

## 2016-09-04 DIAGNOSIS — F5104 Psychophysiologic insomnia: Secondary | ICD-10-CM | POA: Diagnosis not present

## 2016-09-04 MED ORDER — PROPRANOLOL HCL ER 60 MG PO CP24: 60.0000 mg | ORAL_CAPSULE | Freq: Every day | ORAL | 11 refills | Status: DC

## 2016-09-04 MED ORDER — SUMATRIPTAN SUCCINATE 25 MG PO TABS: ORAL_TABLET | ORAL | 11 refills | Status: DC
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
## 2016-09-04 NOTE — Progress Notes (Signed)
I agree with the assessment and plan as directed by NP .The patient is known to me .   Kacey Dysert, MD  

## 2016-09-04 NOTE — Patient Instructions (Signed)
Continue Propranolol and Imitrex Consider long acting Lamictal Continue Trazodone for sleep  If your symptoms worsen or you develop new symptoms please let us know.

## 2016-09-11 ENCOUNTER — Ambulatory Visit (INDEPENDENT_AMBULATORY_CARE_PROVIDER_SITE_OTHER): Payer: 59 | Admitting: Physician Assistant

## 2016-09-11 ENCOUNTER — Encounter: Payer: Self-pay | Admitting: Physician Assistant

## 2016-09-11 VITALS — BP 130/86 | HR 100 | Temp 98.4°F | Resp 16 | Ht 64.0 in | Wt 145.0 lb

## 2016-09-11 DIAGNOSIS — F329 Major depressive disorder, single episode, unspecified: Secondary | ICD-10-CM | POA: Diagnosis not present

## 2016-09-11 DIAGNOSIS — L309 Dermatitis, unspecified: Secondary | ICD-10-CM

## 2016-09-11 DIAGNOSIS — F32A Depression, unspecified: Secondary | ICD-10-CM

## 2016-09-11 DIAGNOSIS — R454 Irritability and anger: Secondary | ICD-10-CM

## 2016-09-11 MED ORDER — LAMOTRIGINE ER 50 MG PO TB24: 50.0000 mg | ORAL_TABLET | Freq: Every day | ORAL | 5 refills | Status: DC

## 2016-09-11 NOTE — Patient Instructions (Addendum)
Try OTC hydrocortisone ointment or antifungal cream to the discoloration in the elbows.    IF you received an x-ray today, you will receive an invoice from [facility]. Please contact [facility] at [phone] with questions or concerns regarding your invoice.   IF you received labwork today, you will receive an invoice from [facility]. Please contact [facility] at [phone] with questions or concerns regarding your invoice.   Our billing staff will not be able to assist you with questions regarding bills from these companies.  You will be contacted with the lab results as soon as they are available. The fastest way to get your results is to activate your My Chart account. Instructions are located on the last page of this paperwork. If you have not heard from us regarding the results in 2 weeks, please contact this office.
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
## 2016-10-05 ENCOUNTER — Other Ambulatory Visit: Payer: Self-pay | Admitting: Physician Assistant

## 2016-10-05 DIAGNOSIS — R454 Irritability and anger: Secondary | ICD-10-CM

## 2016-10-05 DIAGNOSIS — F329 Major depressive disorder, single episode, unspecified: Secondary | ICD-10-CM

## 2016-10-05 DIAGNOSIS — F32A Depression, unspecified: Secondary | ICD-10-CM

## 2016-11-06 ENCOUNTER — Ambulatory Visit: Payer: 59 | Admitting: Physician Assistant

## 2016-11-27 ENCOUNTER — Ambulatory Visit (INDEPENDENT_AMBULATORY_CARE_PROVIDER_SITE_OTHER): Payer: 59 | Admitting: Physician Assistant

## 2016-11-27 ENCOUNTER — Encounter: Payer: Self-pay | Admitting: Physician Assistant

## 2016-11-27 VITALS — BP 122/80 | HR 102 | Temp 98.5°F | Resp 18 | Ht 64.0 in | Wt 159.0 lb

## 2016-11-27 DIAGNOSIS — R454 Irritability and anger: Secondary | ICD-10-CM | POA: Insufficient documentation

## 2016-11-27 NOTE — Assessment & Plan Note (Signed)
Well-controlled on lamotrigine xr 50 mg. Continue.

## 2016-11-27 NOTE — Progress Notes (Signed)
Patient ID: Ulice Dash, female    DOB: [birth year], 31 y.o.   MRN: [id number]  PCP: Porfirio Oar, PA-C  Chief Complaint  Patient presents with  . Follow-up    mood and medications    Subjective:   Presents for evaluation of mood, specifically anger outbursts, since starting lamotrigine and then changing to the extended release formulation.  Very pleased with her current treatment. Working really well. "It's like a drug from the Gods." Time release is working better than the IR, which gave her a burst of energy at night and made sleep an issue. Her friends, family and coworkers are approaching her, asking what she's doing that has improved her mood and reduced her anger so substantially.   Review of Systems No cp, SOB, HA, dizziness. No GU/GI symptoms.    Patient Active Problem List   Diagnosis Date Noted  . Bilateral hand pain 07/03/2016  . Clumsiness 07/03/2016  . Chronic insomnia 02/02/2015  . Daytime hypersomnia 02/02/2015  . Intractable migraine with aura with status migrainosus 02/02/2015  . Seizures (HCC) 11/16/2014  . Dysesthesia of face 11/11/2014  . Headache behind the eyes 11/11/2014  . Spell of altered consciousness 11/11/2014  . Spells of speech arrest 11/11/2014  . Depression 09/15/2013  . Goiter 09/15/2013  . GERD (gastroesophageal reflux disease) 03/03/2013     Prior to Admission medications   Medication Sig Start Date End Date Taking? Authorizing Provider  LamoTRIgine 50 MG TB24 Take 1 tablet (50 mg total) by mouth daily. 09/11/16  Yes Markeshia Giebel, PA-C  propranolol ER (INDERAL LA) 60 MG 24 hr capsule Take 1 capsule (60 mg total) by mouth daily. 09/04/16  Yes Butch Penny, NP  SUMAtriptan (IMITREX) 20 MG/ACT nasal spray Place 1 spray (20 mg total) into the nose every 2 (two) hours as needed for migraine or headache. May repeat in 2 hours if headache persists or recurs. 07/03/16  Yes Carmen Dohmeier, MD  SUMAtriptan (IMITREX) 25 MG  tablet Take 1 tablet at the onset of migraine.May repeat in 2 hours if headache persists or recurs. 09/04/16  Yes Butch Penny, NP  traZODone (DESYREL) 50 MG tablet May take 1 at night for 8 days, if insufficient response increase to 2 tabs HS prn. 07/03/16  Yes Melvyn Novas, MD  zolpidem (AMBIEN) 10 MG tablet Take 1 tablet (10 mg total) by mouth at bedtime as needed for sleep. 07/03/16 08/02/16  Melvyn Novas, MD     Allergies  Allergen Reactions  . Amoxicillin     Swelling/hives  . Serotonin Reuptake Inhibitors (Ssris) Other (See Comments)    Triggers seizures  . Latex Swelling and Rash       Objective:  Physical Exam  Constitutional: She is oriented to person, place, and time. She appears well-developed and well-nourished. She is active and cooperative. No distress.  BP 122/80   Pulse (!) 102   Temp 98.5 F (36.9 C) (Oral)   Resp 18   Ht  (1.626 m)   Wt 159 lb (72.1 kg)   LMP 11/15/2016 (Exact Date)   SpO2 97%   BMI 27.29 kg/m   HENT:  Head: Normocephalic and atraumatic.  Right Ear: Hearing normal.  Left Ear: Hearing normal.  Eyes: Conjunctivae are normal. No scleral icterus.  Neck: Normal range of motion. Neck supple. No thyromegaly present.  Cardiovascular: Normal rate, regular rhythm and normal heart sounds.   Pulses:      Radial pulses are 2+ on the right  side, and 2+ on the left side.  Pulmonary/Chest: Effort normal and breath sounds normal.  Lymphadenopathy:       Head (right side): No tonsillar, no preauricular, no posterior auricular and no occipital adenopathy present.       Head (left side): No tonsillar, no preauricular, no posterior auricular and no occipital adenopathy present.    She has no cervical adenopathy.       Right: No supraclavicular adenopathy present.       Left: No supraclavicular adenopathy present.  Neurological: She is alert and oriented to person, place, and time. No sensory deficit.  Skin: Skin is warm, dry and intact. No  rash noted. No cyanosis or erythema. Nails show no clubbing.  Psychiatric: She has a normal mood and affect. Her speech is normal and behavior is normal.           Assessment & Plan:   Problem List Items Addressed This Visit    Outbursts of anger - Primary    Well-controlled on lamotrigine xr 50 mg. Continue.          Return in about 6 months (around 05/29/2017) for re-evaluation of mood.   Fernande Bras, PA-C Primary Care at [facility]

## 2016-11-27 NOTE — Patient Instructions (Addendum)
Keep up the great work on being your best you!    IF you received an x-ray today, you will receive an invoice from [facility]. Please contact [facility] at [phone] with questions or concerns regarding your invoice.   IF you received labwork today, you will receive an invoice from [facility]. Please contact [facility] at [phone] with questions or concerns regarding your invoice.   Our billing staff will not be able to assist you with questions regarding bills from these companies.  You will be contacted with the lab results as soon as they are available. The fastest way to get your results is to activate your My Chart account. Instructions are located on the last page of this paperwork. If you have not heard from [place] regarding the results in 2 weeks, please contact this office.

## 2017-06-21 ENCOUNTER — Other Ambulatory Visit: Payer: Self-pay | Admitting: Physician Assistant

## 2017-06-21 DIAGNOSIS — F329 Major depressive disorder, single episode, unspecified: Secondary | ICD-10-CM

## 2017-06-21 DIAGNOSIS — F32A Depression, unspecified: Secondary | ICD-10-CM

## 2017-06-21 NOTE — Telephone Encounter (Signed)
Not on approved list for refill. Thanks

## 2017-11-14 ENCOUNTER — Encounter: Payer: Self-pay | Admitting: Physician Assistant

## 2021-05-02 ENCOUNTER — Telehealth: Payer: Self-pay | Admitting: Adult Health

## 2021-05-02 NOTE — Telephone Encounter (Signed)
Pt called states she is having shoulder surgery on 09/14/2021. She says that the surgeon told her she needed clearance from [place] in order to receive the anesthesia, however pt says if she needs to come into the office since It has been 3 years that is fine. Pt requesting a call back.

## 2021-05-03 NOTE — Telephone Encounter (Signed)
Noted  

## 2021-05-03 NOTE — Telephone Encounter (Signed)
[name], this is the patient we spoke about.

## 2021-05-03 NOTE — Telephone Encounter (Signed)
Please call pt to schedule appt with Dr. Vickey Huger for next available. She will need to be seen before providing clearance. Thank you

## 2021-05-03 NOTE — Telephone Encounter (Signed)
Spoke with patient over phone, stated that we would need to have a referral from [facility] for this as she has not been seen by Dr. Vickey Huger in over 3 years. She stated she would give them a call to have that sent over, I advised we would schedule her after we receive that info.

## 2021-09-06 ENCOUNTER — Encounter (HOSPITAL_BASED_OUTPATIENT_CLINIC_OR_DEPARTMENT_OTHER): Payer: Self-pay | Admitting: Orthopaedic Surgery
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
## 2021-09-14 ENCOUNTER — Ambulatory Visit (HOSPITAL_BASED_OUTPATIENT_CLINIC_OR_DEPARTMENT_OTHER): Payer: 59 | Admitting: Anesthesiology

## 2021-09-14 ENCOUNTER — Ambulatory Visit (HOSPITAL_BASED_OUTPATIENT_CLINIC_OR_DEPARTMENT_OTHER)
Admission: RE | Admit: 2021-09-14 | Discharge: 2021-09-14 | Disposition: A | Discharge status: Home / Self Care | Payer: 59 | Attending: Orthopaedic Surgery | Admitting: Orthopaedic Surgery

## 2021-09-14 ENCOUNTER — Other Ambulatory Visit: Payer: Self-pay

## 2021-09-14 ENCOUNTER — Encounter (HOSPITAL_BASED_OUTPATIENT_CLINIC_OR_DEPARTMENT_OTHER): Payer: Self-pay | Admitting: Orthopaedic Surgery

## 2021-09-14 ENCOUNTER — Encounter (HOSPITAL_BASED_OUTPATIENT_CLINIC_OR_DEPARTMENT_OTHER)
Admission: RE | Disposition: A | Discharge status: Home / Self Care | Payer: Self-pay | Source: Home / Self Care | Attending: Orthopaedic Surgery

## 2021-09-14 DIAGNOSIS — M7521 Bicipital tendinitis, right shoulder: Secondary | ICD-10-CM | POA: Insufficient documentation

## 2021-09-14 DIAGNOSIS — M25311 Other instability, right shoulder: Secondary | ICD-10-CM | POA: Diagnosis not present

## 2021-09-14 DIAGNOSIS — M7541 Impingement syndrome of right shoulder: Secondary | ICD-10-CM | POA: Insufficient documentation

## 2021-09-14 HISTORY — DX: Nausea with vomiting, unspecified: R11.2

## 2021-09-14 HISTORY — PX: SHOULDER ARTHROSCOPY WITH SUBACROMIAL DECOMPRESSION: SHX5684

## 2021-09-14 HISTORY — DX: Other specified postprocedural states: Z98.890

## 2021-09-14 HISTORY — PX: BICEPT TENODESIS: SHX5116

## 2021-09-14 LAB — POCT PREGNANCY, URINE: Preg Test, Ur: NEGATIVE

## 2021-09-14 SURGERY — SHOULDER ARTHROSCOPY WITH SUBACROMIAL DECOMPRESSION
Anesthesia: Regional

## 2021-09-14 MED ORDER — FENTANYL CITRATE (PF) 100 MCG/2ML IJ SOLN
INTRAMUSCULAR | Status: AC
  Filled 2021-09-14: qty 2

## 2021-09-14 MED ORDER — PROPOFOL 10 MG/ML IV BOLUS
INTRAVENOUS | Status: AC
  Filled 2021-09-14: qty 20

## 2021-09-14 MED ORDER — SCOPOLAMINE 1 MG/3DAYS TD PT72
1.0000 | MEDICATED_PATCH | TRANSDERMAL | Status: DC
  Administered 2021-09-14: 1.5 mg via TRANSDERMAL

## 2021-09-14 MED ORDER — AMISULPRIDE (ANTIEMETIC) 5 MG/2ML IV SOLN: 10.0000 mg | Freq: Once | INTRAVENOUS | Status: DC | PRN

## 2021-09-14 MED ORDER — DICLOFENAC SODIUM 75 MG PO TBEC: 75.0000 mg | DELAYED_RELEASE_TABLET | Freq: Two times a day (BID) | ORAL | 0 refills | Status: AC

## 2021-09-14 MED ORDER — OXYCODONE HCL 5 MG PO TABS: ORAL_TABLET | ORAL | 0 refills | Status: AC

## 2021-09-14 MED ORDER — ROCURONIUM BROMIDE 10 MG/ML (PF) SYRINGE
PREFILLED_SYRINGE | INTRAVENOUS | Status: AC
  Filled 2021-09-14: qty 10

## 2021-09-14 MED ORDER — SCOPOLAMINE 1 MG/3DAYS TD PT72
MEDICATED_PATCH | TRANSDERMAL | Status: AC
  Filled 2021-09-14: qty 1

## 2021-09-14 MED ORDER — MIDAZOLAM HCL 2 MG/2ML IJ SOLN
2.0000 mg | Freq: Once | INTRAMUSCULAR | Status: AC
  Administered 2021-09-14: 2 mg via INTRAVENOUS

## 2021-09-14 MED ORDER — FENTANYL CITRATE (PF) 100 MCG/2ML IJ SOLN
INTRAMUSCULAR | Status: DC | PRN
  Administered 2021-09-14: 50 ug via INTRAVENOUS

## 2021-09-14 MED ORDER — ACETAMINOPHEN 500 MG PO TABS
ORAL_TABLET | ORAL | Status: AC
  Filled 2021-09-14: qty 2

## 2021-09-14 MED ORDER — OXYCODONE HCL 5 MG PO TABS: 5.0000 mg | ORAL_TABLET | Freq: Once | ORAL | Status: DC | PRN

## 2021-09-14 MED ORDER — VANCOMYCIN HCL IN DEXTROSE 1-5 GM/200ML-% IV SOLN
1000.0000 mg | INTRAVENOUS | Status: AC
  Administered 2021-09-14: 1000 mg via INTRAVENOUS

## 2021-09-14 MED ORDER — OXYCODONE HCL 5 MG/5ML PO SOLN: 5.0000 mg | Freq: Once | ORAL | Status: DC | PRN

## 2021-09-14 MED ORDER — ROCURONIUM BROMIDE 100 MG/10ML IV SOLN
INTRAVENOUS | Status: DC | PRN
  Administered 2021-09-14: 70 mg via INTRAVENOUS

## 2021-09-14 MED ORDER — ACETAMINOPHEN 500 MG PO TABS: 1000.0000 mg | ORAL_TABLET | Freq: Three times a day (TID) | ORAL | 0 refills | Status: AC

## 2021-09-14 MED ORDER — LIDOCAINE 2% (20 MG/ML) 5 ML SYRINGE
INTRAMUSCULAR | Status: DC | PRN
  Administered 2021-09-14: 80 mg via INTRAVENOUS

## 2021-09-14 MED ORDER — PHENYLEPHRINE HCL (PRESSORS) 10 MG/ML IV SOLN
INTRAVENOUS | Status: DC | PRN
  Administered 2021-09-14: 40 ug via INTRAVENOUS

## 2021-09-14 MED ORDER — FENTANYL CITRATE (PF) 100 MCG/2ML IJ SOLN
25.0000 ug | INTRAMUSCULAR | Status: DC | PRN
  Administered 2021-09-14 (×2): 50 ug via INTRAVENOUS

## 2021-09-14 MED ORDER — ONDANSETRON HCL 4 MG/2ML IJ SOLN
INTRAMUSCULAR | Status: DC | PRN
  Administered 2021-09-14: 4 mg via INTRAVENOUS

## 2021-09-14 MED ORDER — PROPOFOL 10 MG/ML IV BOLUS
INTRAVENOUS | Status: DC | PRN
Start: 2021-09-14 — End: 2021-09-14
  Administered 2021-09-14: 200 mg via INTRAVENOUS

## 2021-09-14 MED ORDER — DEXAMETHASONE SODIUM PHOSPHATE 10 MG/ML IJ SOLN
INTRAMUSCULAR | Status: DC | PRN
  Administered 2021-09-14: 10 mg via INTRAVENOUS

## 2021-09-14 MED ORDER — BUPIVACAINE LIPOSOME 1.3 % IJ SUSP
INTRAMUSCULAR | Status: DC | PRN
  Administered 2021-09-14: 10 mL via PERINEURAL

## 2021-09-14 MED ORDER — MIDAZOLAM HCL 2 MG/2ML IJ SOLN
INTRAMUSCULAR | Status: AC
  Filled 2021-09-14: qty 2

## 2021-09-14 MED ORDER — ACETAMINOPHEN 500 MG PO TABS
1000.0000 mg | ORAL_TABLET | Freq: Once | ORAL | Status: AC
  Administered 2021-09-14: 1000 mg via ORAL

## 2021-09-14 MED ORDER — SCOPOLAMINE 1 MG/3DAYS TD PT72: 1.0000 | MEDICATED_PATCH | TRANSDERMAL | Status: DC

## 2021-09-14 MED ORDER — ONDANSETRON HCL 4 MG PO TABS: 4.0000 mg | ORAL_TABLET | Freq: Three times a day (TID) | ORAL | 0 refills | Status: AC | PRN

## 2021-09-14 MED ORDER — SUGAMMADEX SODIUM 200 MG/2ML IV SOLN
INTRAVENOUS | Status: DC | PRN
  Administered 2021-09-14: 300 mg via INTRAVENOUS

## 2021-09-14 MED ORDER — LIDOCAINE 2% (20 MG/ML) 5 ML SYRINGE
INTRAMUSCULAR | Status: AC
  Filled 2021-09-14: qty 5

## 2021-09-14 MED ORDER — ONDANSETRON HCL 4 MG/2ML IJ SOLN
INTRAMUSCULAR | Status: AC
  Filled 2021-09-14: qty 2

## 2021-09-14 MED ORDER — ACETAMINOPHEN 500 MG PO TABS: 1000.0000 mg | ORAL_TABLET | Freq: Once | ORAL | Status: DC

## 2021-09-14 MED ORDER — ONDANSETRON HCL 4 MG/2ML IJ SOLN: 4.0000 mg | Freq: Once | INTRAMUSCULAR | Status: DC | PRN

## 2021-09-14 MED ORDER — PHENYLEPHRINE 40 MCG/ML (10ML) SYRINGE FOR IV PUSH (FOR BLOOD PRESSURE SUPPORT)
PREFILLED_SYRINGE | INTRAVENOUS | Status: AC
  Filled 2021-09-14: qty 10

## 2021-09-14 MED ORDER — FENTANYL CITRATE (PF) 100 MCG/2ML IJ SOLN
100.0000 ug | Freq: Once | INTRAMUSCULAR | Status: AC
  Administered 2021-09-14: 100 ug via INTRAVENOUS

## 2021-09-14 MED ORDER — SODIUM CHLORIDE 0.9 % IR SOLN
Status: DC | PRN
  Administered 2021-09-14: 6000 mL

## 2021-09-14 MED ORDER — BUPIVACAINE HCL (PF) 0.5 % IJ SOLN
INTRAMUSCULAR | Status: DC | PRN
Start: 2021-09-14 — End: 2021-09-14
  Administered 2021-09-14: 15 mL via PERINEURAL

## 2021-09-14 MED ORDER — LACTATED RINGERS IV SOLN: INTRAVENOUS | Status: DC

## 2021-09-14 MED ORDER — DEXAMETHASONE SODIUM PHOSPHATE 10 MG/ML IJ SOLN
INTRAMUSCULAR | Status: AC
  Filled 2021-09-14: qty 1

## 2021-09-14 MED ORDER — VANCOMYCIN HCL IN DEXTROSE 1-5 GM/200ML-% IV SOLN
INTRAVENOUS | Status: AC
  Filled 2021-09-14: qty 200

## 2021-09-14 SURGICAL SUPPLY — 60 items
ANCH SUT 2 FBRTK KNTLS 1.8 (Anchor) ×4 IMPLANT
ANCHOR SUT 1.8 FIBERTAK SB KL (Anchor) ×4 IMPLANT
APL PRP STRL LF DISP 70% ISPRP (MISCELLANEOUS) ×2
BLADE EXCALIBUR 4.0X13 (MISCELLANEOUS) ×4 IMPLANT
BNDG COHESIVE 4X5 TAN ST LF (GAUZE/BANDAGES/DRESSINGS) IMPLANT
BUR SURG 4D 13L RD FLUTE (BUR) ×3 IMPLANT
BURR OVAL 8 FLU 4.0X13 (MISCELLANEOUS) ×2 IMPLANT
BURR SURG 4D 13L RD FLUTE (BUR) ×3
CANNULA 5.75X71 LONG (CANNULA) IMPLANT
CANNULA PASSPORT BUTTON 10-40 (CANNULA) ×2 IMPLANT
CANNULA TWIST IN 8.25X7CM (CANNULA) IMPLANT
CHLORAPREP W/TINT 26 (MISCELLANEOUS) ×4 IMPLANT
COOLER ICEMAN CLASSIC (MISCELLANEOUS) ×4 IMPLANT
DECANTER SPIKE VIAL GLASS SM (MISCELLANEOUS) IMPLANT
DISSECTOR 3.5MM X 13CM CVD (MISCELLANEOUS) IMPLANT
DISSECTOR 4.0MMX13CM CVD (MISCELLANEOUS) IMPLANT
DRAPE IMP U-DRAPE 54X76 (DRAPES) ×4 IMPLANT
DRAPE INCISE IOBAN 66X45 STRL (DRAPES) IMPLANT
DRAPE SHOULDER BEACH CHAIR (DRAPES) ×4 IMPLANT
DRSG PAD ABDOMINAL 8X10 ST (GAUZE/BANDAGES/DRESSINGS) ×4 IMPLANT
DW OUTFLOW CASSETTE/TUBE SET (MISCELLANEOUS) ×4 IMPLANT
GAUZE SPONGE 4X4 12PLY STRL (GAUZE/BANDAGES/DRESSINGS) ×4 IMPLANT
GLOVE SRG 8 PF TXTR STRL LF DI (GLOVE) ×3 IMPLANT
GLOVE SURG ENC MOIS LTX SZ6.5 (GLOVE) ×4 IMPLANT
GLOVE SURG LTX SZ8 (GLOVE) ×4 IMPLANT
GLOVE SURG UNDER POLY LF SZ6.5 (GLOVE) ×4 IMPLANT
GLOVE SURG UNDER POLY LF SZ8 (GLOVE) ×3
GOWN STRL REUS W/ TWL LRG LVL3 (GOWN DISPOSABLE) ×6 IMPLANT
GOWN STRL REUS W/TWL LRG LVL3 (GOWN DISPOSABLE) ×6
GOWN STRL REUS W/TWL XL LVL3 (GOWN DISPOSABLE) ×4 IMPLANT
KIT PERC INSERT 3.0 KNTLS (KITS) IMPLANT
KIT STR SPEAR 1.8 FBRTK DISP (KITS) ×2 IMPLANT
LASSO 90 CVE QUICKPAS (DISPOSABLE) IMPLANT
LASSO CRESCENT QUICKPASS (SUTURE) IMPLANT
MANIFOLD NEPTUNE II (INSTRUMENTS) ×4 IMPLANT
NDL SAFETY ECLIPSE 18X1.5 (NEEDLE) ×3 IMPLANT
NEEDLE HYPO 18GX1.5 SHARP (NEEDLE) ×3
PACK ARTHROSCOPY DSU (CUSTOM PROCEDURE TRAY) ×4 IMPLANT
PACK BASIN DAY SURGERY FS (CUSTOM PROCEDURE TRAY) ×4 IMPLANT
PAD COLD SHLDR WRAP-ON (PAD) ×4 IMPLANT
PAD ORTHO SHOULDER 7X19 LRG (SOFTGOODS) IMPLANT
PORT APPOLLO RF 90DEGREE MULTI (SURGICAL WAND) IMPLANT
SHEET MEDIUM DRAPE 40X70 STRL (DRAPES) IMPLANT
SLEEVE ARM SUSPENSION SYSTEM (MISCELLANEOUS) IMPLANT
SLEEVE SCD COMPRESS KNEE MED (STOCKING) ×4 IMPLANT
SLING ARM FOAM STRAP LRG (SOFTGOODS) IMPLANT
SLING S3 LATERAL DISP (MISCELLANEOUS) IMPLANT
STRIP CLOSURE SKIN 1/2X4 (GAUZE/BANDAGES/DRESSINGS) ×4 IMPLANT
SUT FIBERWIRE #2 38 T-5 BLUE (SUTURE)
SUT MNCRL AB 4-0 PS2 18 (SUTURE) ×4 IMPLANT
SUT PDS AB 0 CT 36 (SUTURE) ×2 IMPLANT
SUT TIGER TAPE 7 IN WHITE (SUTURE) IMPLANT
SUTURE FIBERWR #2 38 T-5 BLUE (SUTURE) IMPLANT
SUTURE TAPE TIGERLINK 1.3MM BL (SUTURE) IMPLANT
SUTURETAPE TIGERLINK 1.3MM BL (SUTURE)
SYR 5ML LL (SYRINGE) ×4 IMPLANT
TAPE FIBER 2MM 7IN #2 BLUE (SUTURE) IMPLANT
TOWEL GREEN STERILE FF (TOWEL DISPOSABLE) ×4 IMPLANT
TUBE CONNECTING 20X1/4 (TUBING) IMPLANT
TUBING ARTHROSCOPY IRRIG 16FT (MISCELLANEOUS) ×4 IMPLANT

## 2021-09-14 NOTE — Progress Notes (Signed)
AssistedDr. Bass with right, ultrasound guided, interscalene  block. Side rails up, monitors on throughout procedure. See vital signs in flow sheet. Tolerated Procedure well.  

## 2021-09-14 NOTE — Anesthesia Preprocedure Evaluation (Addendum)
Anesthesia Evaluation  Patient identified by MRN, date of birth, ID band Patient awake    Reviewed: Allergy & Precautions, NPO status , Patient's Chart, lab work & pertinent test results  History of Anesthesia Complications (+) PONV and history of anesthetic complications  Airway Mallampati: II  TM Distance: >3 FB Neck ROM: Full    Dental   Permanent retainer at the bottom:   Pulmonary neg pulmonary ROS,    Pulmonary exam normal breath sounds clear to auscultation       Cardiovascular Exercise Tolerance: Good negative cardio ROS Normal cardiovascular exam Rhythm:Regular Rate:Normal     Neuro/Psych  Headaches, Seizures - (associated with previous medications),  PSYCHIATRIC DISORDERS Anxiety Depression    GI/Hepatic GERD  ,  Endo/Other  diabetes, Gestational  Renal/GU   negative genitourinary   Musculoskeletal negative musculoskeletal ROS (+)   Abdominal   Peds negative pediatric ROS (+)  Hematology negative hematology ROS (+)   Anesthesia Other Findings   Reproductive/Obstetrics negative OB ROS                           Anesthesia Physical Anesthesia Plan  ASA: 2  Anesthesia Plan: General and Regional   Post-op Pain Management: Regional block   Induction: Intravenous  PONV Risk Score and Plan: 4 or greater and Treatment may vary due to age or medical condition, Midazolam, Scopolamine patch - Pre-op, Ondansetron and Dexamethasone  Airway Management Planned:   Additional Equipment: None  Intra-op Plan:   Post-operative Plan: Extubation in OR  Informed Consent: I have reviewed the patients History and Physical, chart, labs and discussed the procedure including the risks, benefits and alternatives for the proposed anesthesia with the patient or authorized representative who has indicated his/her understanding and acceptance.     Dental advisory given  Plan Discussed with:  CRNA, Anesthesiologist and Surgeon  Anesthesia Plan Comments: (Interscalene block with Exparel. GETA. )        Anesthesia Quick Evaluation

## 2021-09-14 NOTE — Discharge Instructions (Addendum)
Natalie Marrow MD, MPH Alfonse Alpers, PA-C [facility] [address] [phone] (tel)   [phone] (fax)   POST-OPERATIVE INSTRUCTIONS - SHOULDER ARTHROSCOPY  WOUND CARE You may remove the Operative Dressing on Post-Op Day #3 (72hrs after surgery).   Alternatively if you would like you can leave dressing on until follow-up if within 7-8 days but keep it dry. Leave steri-strips in place until they fall off on their own, usually 2 weeks postop. There may be a small amount of fluid/bleeding leaking at the surgical site.  This is normal; the shoulder is filled with fluid during the procedure and can leak for 24-48hrs after surgery.  You may change/reinforce the bandage as needed.  Use the Cryocuff or Ice as often as possible for the first 7 days, then as needed for pain relief. Always keep a towel, ACE wrap or other barrier between the cooling unit and your skin.  You may shower on Post-Op Day #3. Gently pat the area dry. Do not soak the shoulder in water or submerge it. Keep incisions as dry as possible. Do not go swimming in the pool or ocean until 4 weeks after surgery or when otherwise instructed.    EXERCISES/BRACING Sling should be used at all times until follow-up.  You can remove sling for hygiene.    Please continue to ambulate and do not stay sitting or lying for too long. Perform foot and wrist pumps to assist in circulation.  POST-OP MEDICATIONS- Multimodal approach to pain control In general your pain will be controlled with a combination of substances.  Prescriptions unless otherwise discussed are electronically sent to your pharmacy.  This is a carefully made plan we use to minimize narcotic use.     Diclofenac - Anti-inflammatory medication taken on a scheduled basis Acetaminophen - Non-narcotic pain medicine taken on a scheduled basis  Oxycodone - This is a strong narcotic, to be used only on an as needed basis for SEVERE pain. Zofran  - take as needed for nausea   FOLLOW-UP If you develop a Fever (?101.5), Redness or Drainage from the surgical incision site, please call our office to arrange for an evaluation. Please call the office to schedule a follow-up appointment, 7-10 days post-operatively. (If you have not already done so)    HELPFUL INFORMATION  If you had a block, it will wear off between 8-24 hrs postop typically.  This is period when your pain may go from nearly zero to the pain you would have had postop without the block.  This is an abrupt transition but nothing dangerous is happening.  You may take an extra dose of narcotic when this happens.  You may be more comfortable sleeping in a semi-seated position the first few nights following surgery.  Keep a pillow propped under the elbow and forearm for comfort.  If you have a recliner type of chair it might be beneficial.  If not that is fine too, but it would be helpful to sleep propped up with pillows behind your operated shoulder as well under your elbow and forearm.  This will reduce pulling on the suture lines.  When dressing, put your operative arm in the sleeve first.  When getting undressed, take your operative arm out last.  Loose fitting, button-down shirts are recommended.  Often in the first days after surgery you may be more comfortable keeping your operative arm under your shirt and not through the sleeve.  You may return to work/school in the  next couple of days when you feel up to it.  Desk work and typing in the sling is fine.  We suggest you use the pain medication the first night prior to going to bed, in order to ease any pain when the anesthesia wears off. You should avoid taking pain medications on an empty stomach as it will make you nauseous.  You should wean off your narcotic medicines as soon as you are able.  Most patients will be off or using minimal narcotics before their first postop appointment.   Do not drink alcoholic beverages or  take illicit drugs when taking pain medications.  It is against the law to drive while taking narcotics.  In some states it is against the law to drive while your arm is in a sling.   Pain medication may make you constipated.  Below are a few solutions to try in this order: Decrease the amount of pain medication if you aren't having pain. Drink lots of decaffeinated fluids. Drink prune juice and/or eat dried prunes  If the first 3 don't work start with additional solutions Take Colace - an over-the-counter stool softener Take Senokot - an over-the-counter laxative Take Miralax - a stronger over-the-counter laxative  For more information including helpful videos and documents visit our website:   [email or web address]   Post Anesthesia Home Care Instructions  Activity: Get plenty of rest for the remainder of the day. A responsible individual must stay with you for 24 hours following the procedure.  For the next 24 hours, DO NOT: -Drive a car -Advertising copywriter -Drink alcoholic beverages -Take any medication unless instructed by your physician -Make any legal decisions or sign important papers.  Meals: Start with liquid foods such as gelatin or soup. Progress to regular foods as tolerated. Avoid greasy, spicy, heavy foods. If nausea and/or vomiting occur, drink only clear liquids until the nausea and/or vomiting subsides. Call your physician if vomiting continues.  Special Instructions/Symptoms: Your throat may feel dry or sore from the anesthesia or the breathing tube placed in your throat during surgery. If this causes discomfort, gargle with warm salt water. The discomfort should disappear within 24 hours.  If you had a scopolamine patch placed behind your ear for the management of post- operative nausea and/or vomiting:  1. The medication in the patch is effective for 72 hours, after which it should be removed.  Wrap patch in a tissue and discard  in the trash. Wash hands thoroughly with soap and water. 2. You may remove the patch earlier than 72 hours if you experience unpleasant side effects which may include dry mouth, dizziness or visual disturbances. 3. Avoid touching the patch. Wash your hands with soap and water after contact with the patch.

## 2021-09-14 NOTE — Anesthesia Procedure Notes (Signed)
Procedure Name: Intubation Date/Time: 09/14/2021 9:33 AM Performed by: Lavonia Dana, CRNA Pre-anesthesia Checklist: Patient identified, Emergency Drugs available, Suction available and Patient being monitored Patient Re-evaluated:Patient Re-evaluated prior to induction Oxygen Delivery Method: Circle system utilized Preoxygenation: Pre-oxygenation with 100% oxygen Induction Type: IV induction Ventilation: Mask ventilation without difficulty Laryngoscope Size: Mac and 3 Grade View: Grade I Tube type: Oral Tube size: 7.0 mm Number of attempts: 1 Airway Equipment and Method: Stylet and Bite block Placement Confirmation: ETT inserted through vocal cords under direct vision, positive ETCO2 and breath sounds checked- equal and bilateral Secured at: 24 cm Tube secured with: Tape Dental Injury: Teeth and Oropharynx as per pre-operative assessment

## 2021-09-14 NOTE — Transfer of Care (Signed)
Immediate Anesthesia Transfer of Care Note  Patient: [name]  Procedure(s) Performed: SHOULDER ARTHROSCOPY WITH SUBACROMIAL DECOMPRESSION BICEPS TENODESIS  Patient Location: PACU  Anesthesia Type:GA combined with regional for post-op pain  Level of Consciousness: drowsy  Airway & Oxygen Therapy: Patient Spontanous Breathing and Patient connected to face mask oxygen  Post-op Assessment: Report given to RN and Post -op Vital signs reviewed and stable  Post vital signs: Reviewed and stable  Last Vitals:  Vitals Value Taken Time  BP 152/97 09/14/21 1030  Temp    Pulse 99 09/14/21 1031  Resp 31 09/14/21 1031  SpO2 100 % 09/14/21 1031  Vitals shown include unvalidated device data.  Last Pain:  Vitals:   09/14/21 0826  TempSrc: Oral  PainSc: 8       Patients Stated Pain Goal: 4 ([id number])  Complications: No notable events documented.

## 2021-09-14 NOTE — Op Note (Signed)
Orthopaedic Surgery Operative Note (CSN: [id number])  [name]  1985/10/09 Date of Surgery: 09/14/2021   DIAGNOSES: Right shoulder, biceps tendinitis and subacromial impingement.  POST-OPERATIVE DIAGNOSIS: same  PROCEDURE: Arthroscopic extensive debridement - 29823 Subdeltoid Bursa, Posterior Labrum, and capsule Arthroscopic subacromial decompression - 94174 Arthroscopic biceps tenodesis - 08144   OPERATIVE FINDING: Exam under anesthesia: Normal, no posterior instability or anterior instability when specifically checked. Articular space:  posterior labrum had some fraying but no distinct tear. Chondral surfaces: Normal Biceps:  Redness of the tendon itself but anchor was intact.   Subscapularis: Intact  Supraspinatus: Intact  Infraspinatus: Intact   We initially had thought that the patient had posterior labral pathology but on exam and evaluation she had no obvious significant destabilizing tears there.  Instead based on her pre-operative exam we felt that a biceps tenodesis would help alleviate her anterior shoulder symptoms and had discussed this with her preop.  She had a subacromial spur thus we felt that removal of such in light of her response to a subacromial injection would be appropriate.   Post-operative plan: The patient will be non-weightbearing in a sling for 4 weeks.  The patient will be discharged home.  DVT prophylaxis not indicated in ambulatory upper extremity patient without known risk factors.   Pain control with PRN pain medication preferring oral medicines.  Follow up plan will be scheduled in approximately 7 days for incision check and XR.  Surgeons:Primary: Bjorn Pippin, MD Assistants:Caroline McBane PA-C Location: [facility] OR ROOM 6 Anesthesia: General with Exparel interscalene block Antibiotics: Ancef 2 g Tourniquet time: None Estimated Blood Loss: Minimal Complications: None Specimens: None Implants: Implant Name Type Inv. Item Serial No.  Manufacturer Lot No. LRB No. Used Action  ANCHOR SUT 1.8 FIBERTAK SB KL - [id number] Anchor ANCHOR SUT 1.8 FIBERTAK SB KL  [employer] [id number] Right 1 Implanted  ANCHOR SUT 1.8 FIBERTAK SB KL - [id number] Anchor ANCHOR SUT 1.8 [employer] [id number] Right 1 Implanted    Indications for Surgery:   [name] is a 36 y.o. female with continued shoulder pain refractory to nonoperative measures for extended period of time.    The risks and benefits were explained at length including but not limited to continued pain, cuff failure, biceps tenodesis failure, stiffness, need for further surgery and infection.   Procedure:   Patient was correctly identified in the preoperative holding area and operative site marked.  Patient brought to OR and positioned beachchair on an [place] table ensuring that all bony prominences were padded and the head was in an appropriate location.  Anesthesia was induced and the operative shoulder was prepped and draped in the usual sterile fashion.  Timeout was called preincision.  A standard posterior viewing portal was made after localizing the portal with a spinal needle.  An anterior accessory portal was also made.  After clearing the articular space the camera was positioned in the subacromial space.  Findings above.    Extensive debridement was performed of the anterior interval tissue, labral fraying and the bursa.  Subacromial decompression: We made a lateral portal with spinal needle guidance. We then proceeded to debride bursal tissue extensively with a shaver and arthrocare device. At that point we continued to identify the borders of the acromion and identify the spur. We then carefully preserved the deltoid fascia and used a burr to convert the acromion to a Type 1 flat acromion without issue.  Biceps tenodesis: We marked the tendon  and then performed a tenotomy and debridement of the stump in the articular space. We then identified the biceps  tendon in its groove suprapec with the arthroscope in the lateral portal taking care to move from lateral to medial to avoid injury to the subscapularis. At that point we unroofed the tendon itself and mobilized it. An accessory anterior portal was made in line with the tendon and we grasped it from the anterior superior portal and worked from the accessory anterior portal. Two Fibertak 1.39mm knotless anchors were placed in the groove and the tendon was secured in a luggage loop style fashion with a pass of the limb of suture through the tendon using a scorpion device to avoid pull-through.  Repair was completed with good tension on the tendon.  Residual stump of the tendon was removed after being resected with a RF ablator.   The incisions were closed with absorbable monocryl and steri strips.  A sterile dressing was placed along with a sling. The patient was awoken from general anesthesia and taken to the PACU in stable condition without complication.   Alfonse Alpers, PA-C, present and scrubbed throughout the case, critical for completion in a timely fashion, and for retraction, instrumentation, closure.

## 2021-09-14 NOTE — Interval H&P Note (Signed)
All questions answered, patient wants to proceed with procedure. ? ?

## 2021-09-14 NOTE — Anesthesia Procedure Notes (Signed)
Anesthesia Regional Block: Interscalene brachial plexus block   Pre-Anesthetic Checklist: , timeout performed,  Correct Patient, Correct Site, Correct Laterality,  Correct Procedure, Correct Position, site marked,  Risks and benefits discussed,  Surgical consent,  Pre-op evaluation,  At surgeon's request and post-op pain management  Laterality: Right  Prep: chloraprep       Needles:  Injection technique: Single-shot  Needle Type: Echogenic Stimulator Needle     Needle Length: 10cm  Needle Gauge: 20     Additional Needles:   Procedures:,,,, ultrasound used (permanent image in chart),,    Narrative:  Start time: 09/14/2021 8:40 AM End time: 09/14/2021 8:45 AM Injection made incrementally with aspirations every 5 mL.  Performed by: Personally  Anesthesiologist: Mellody Dance, MD  Additional Notes: Standard monitors applied. Skin prepped. Good needle visualization with ultrasound. Injection made in 5cc increments with no resistance to injection. Patient tolerated the procedure well.

## 2021-09-14 NOTE — Anesthesia Postprocedure Evaluation (Signed)
Anesthesia Post Note  Patient: [name]  Procedure(s) Performed: SHOULDER ARTHROSCOPY WITH SUBACROMIAL DECOMPRESSION BICEPS TENODESIS     Patient location during evaluation: PACU Anesthesia Type: Regional Level of consciousness: awake Pain management: pain level controlled Vital Signs Assessment: post-procedure vital signs reviewed and stable Respiratory status: spontaneous breathing and respiratory function stable Cardiovascular status: stable Postop Assessment: no apparent nausea or vomiting Anesthetic complications: no   No notable events documented.  Last Vitals:  Vitals:   09/14/21 1030 09/14/21 1045  BP: (!) 152/97 (!) 141/96  Pulse: (!) 104 99  Resp: 18 20  Temp: (!) 36.2 C   SpO2: 100% 100%    Last Pain:  Vitals:   09/14/21 1045  TempSrc:   PainSc: 5                  Candra R Rebakah Cokley

## 2021-09-15 ENCOUNTER — Encounter (HOSPITAL_BASED_OUTPATIENT_CLINIC_OR_DEPARTMENT_OTHER): Payer: Self-pay | Admitting: Orthopaedic Surgery
